# Patient Record
Sex: Male | Born: 1955
Health system: Southern US, Community
[De-identification: ages and names within clinical notes are randomized; demographics above are authoritative.]

## PROBLEM LIST (undated history)

## (undated) DIAGNOSIS — K219 Gastro-esophageal reflux disease without esophagitis: Secondary | ICD-10-CM

## (undated) DIAGNOSIS — B159 Hepatitis A without hepatic coma: Secondary | ICD-10-CM

## (undated) DIAGNOSIS — B589 Toxoplasmosis, unspecified: Secondary | ICD-10-CM

## (undated) DIAGNOSIS — F419 Anxiety disorder, unspecified: Secondary | ICD-10-CM

## (undated) DIAGNOSIS — I10 Essential (primary) hypertension: Secondary | ICD-10-CM

## (undated) HISTORY — PX: ROTATOR CUFF REPAIR: SHX139

## (undated) HISTORY — PX: VASECTOMY: SHX75

## (undated) HISTORY — PX: OTHER SURGICAL HISTORY: SHX169

## (undated) HISTORY — PX: INGUINAL HERNIA REPAIR: SUR1180

## (undated) HISTORY — PX: TOTAL KNEE ARTHROPLASTY: SHX125

## (undated) HISTORY — PX: BACK SURGERY: SHX140

## (undated) HISTORY — PX: COLONOSCOPY: SHX174

## (undated) HISTORY — PX: ELBOW SURGERY: SHX618

---

## 1999-05-03 ENCOUNTER — Ambulatory Visit (HOSPITAL_COMMUNITY): Admission: RE | Admit: 1999-05-03 | Discharge: 1999-05-03 | Payer: Self-pay | Admitting: Orthopedic Surgery

## 1999-05-03 ENCOUNTER — Encounter: Payer: Self-pay | Admitting: Orthopedic Surgery

## 1999-05-23 ENCOUNTER — Encounter: Payer: Self-pay | Admitting: Neurosurgery

## 1999-05-23 ENCOUNTER — Encounter: Admission: RE | Admit: 1999-05-23 | Discharge: 1999-05-23 | Payer: Self-pay | Admitting: Neurosurgery

## 2000-10-16 ENCOUNTER — Ambulatory Visit (HOSPITAL_COMMUNITY): Admission: RE | Admit: 2000-10-16 | Discharge: 2000-10-16 | Payer: Self-pay | Admitting: Neurosurgery

## 2000-10-16 ENCOUNTER — Encounter: Payer: Self-pay | Admitting: Neurosurgery

## 2002-07-02 ENCOUNTER — Encounter: Admission: RE | Admit: 2002-07-02 | Discharge: 2002-09-30 | Payer: Self-pay | Admitting: Family Medicine

## 2004-02-23 ENCOUNTER — Encounter: Admission: RE | Admit: 2004-02-23 | Discharge: 2004-02-23 | Payer: Self-pay | Admitting: Family Medicine

## 2004-03-18 ENCOUNTER — Encounter: Admission: RE | Admit: 2004-03-18 | Discharge: 2004-03-18 | Payer: Self-pay | Admitting: Neurological Surgery

## 2004-05-17 ENCOUNTER — Ambulatory Visit (HOSPITAL_COMMUNITY): Admission: RE | Admit: 2004-05-17 | Discharge: 2004-05-17 | Payer: Self-pay | Admitting: Gastroenterology

## 2004-05-17 ENCOUNTER — Encounter (INDEPENDENT_AMBULATORY_CARE_PROVIDER_SITE_OTHER): Payer: Self-pay | Admitting: *Deleted

## 2005-03-07 ENCOUNTER — Encounter: Admission: RE | Admit: 2005-03-07 | Discharge: 2005-03-07 | Payer: Self-pay | Admitting: Orthopedic Surgery

## 2005-03-24 ENCOUNTER — Ambulatory Visit (HOSPITAL_BASED_OUTPATIENT_CLINIC_OR_DEPARTMENT_OTHER): Admission: RE | Admit: 2005-03-24 | Discharge: 2005-03-24 | Payer: Self-pay | Admitting: Orthopedic Surgery

## 2005-06-13 ENCOUNTER — Encounter: Admission: RE | Admit: 2005-06-13 | Discharge: 2005-06-13 | Payer: Self-pay | Admitting: Anesthesiology

## 2005-06-14 ENCOUNTER — Ambulatory Visit (HOSPITAL_COMMUNITY): Admission: RE | Admit: 2005-06-14 | Discharge: 2005-06-14 | Payer: Self-pay | Admitting: Neurological Surgery

## 2005-11-17 ENCOUNTER — Encounter: Admission: RE | Admit: 2005-11-17 | Discharge: 2005-11-17 | Payer: Self-pay | Admitting: Neurological Surgery

## 2005-12-11 ENCOUNTER — Encounter: Admission: RE | Admit: 2005-12-11 | Discharge: 2005-12-11 | Payer: Self-pay | Admitting: Unknown Physician Specialty

## 2006-03-08 ENCOUNTER — Ambulatory Visit (HOSPITAL_COMMUNITY): Admission: RE | Admit: 2006-03-08 | Discharge: 2006-03-09 | Payer: Self-pay | Admitting: Neurological Surgery

## 2006-04-03 ENCOUNTER — Encounter: Admission: RE | Admit: 2006-04-03 | Discharge: 2006-04-03 | Payer: Self-pay | Admitting: Neurological Surgery

## 2006-06-04 ENCOUNTER — Encounter: Admission: RE | Admit: 2006-06-04 | Discharge: 2006-06-04 | Payer: Self-pay | Admitting: Neurological Surgery

## 2006-09-17 ENCOUNTER — Encounter: Admission: RE | Admit: 2006-09-17 | Discharge: 2006-09-17 | Payer: Self-pay | Admitting: Neurological Surgery

## 2006-12-13 ENCOUNTER — Ambulatory Visit (HOSPITAL_COMMUNITY): Admission: RE | Admit: 2006-12-13 | Discharge: 2006-12-13 | Payer: Self-pay | Admitting: Neurological Surgery

## 2006-12-13 ENCOUNTER — Encounter (INDEPENDENT_AMBULATORY_CARE_PROVIDER_SITE_OTHER): Payer: Self-pay | Admitting: Neurological Surgery

## 2008-02-07 IMAGING — CR DG CERVICAL SPINE 2 OR 3 VIEWS
1 series · 1 of 1 positions shown · non-contrast
Comparison: [DATE].

CLINICAL DATA: T1-2 microdiscectomy.
 PORTABLE CERVICAL SPINE ? 2 VIEW:

[view not recorded]
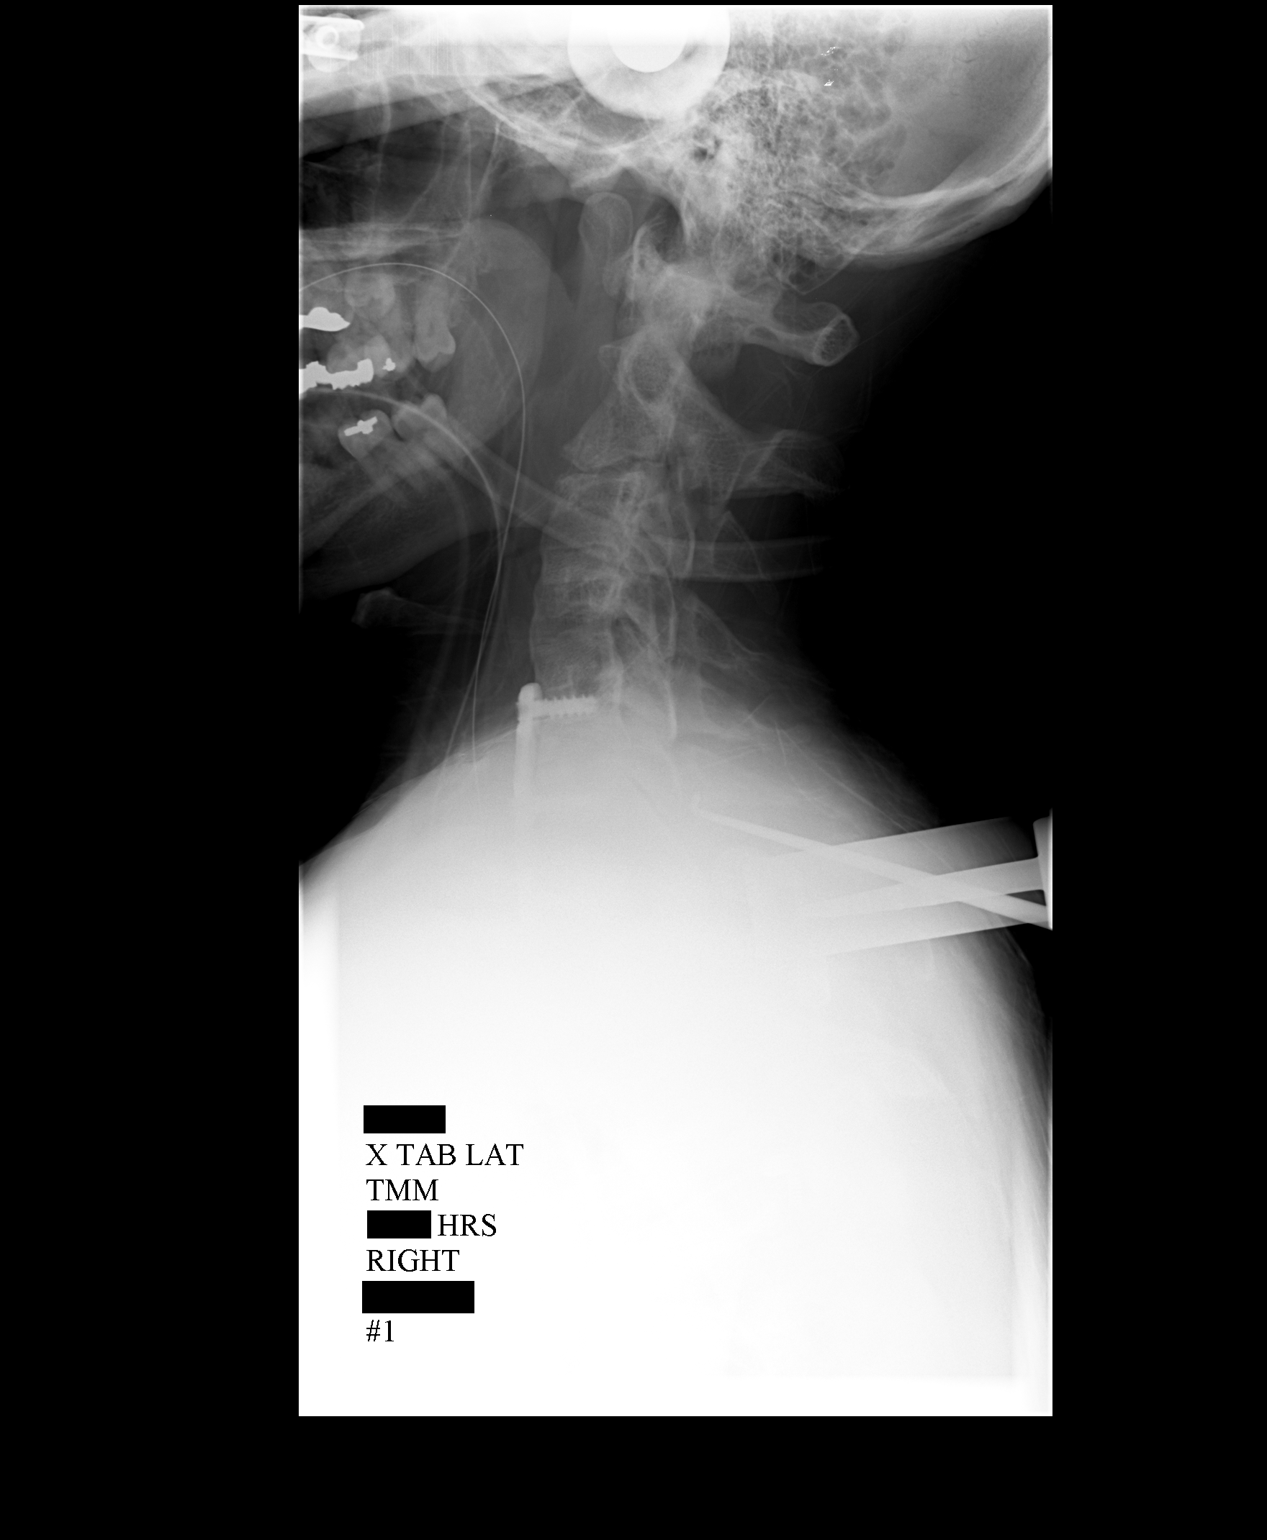

[1 of 1 positions shown; findings below may reference images not displayed]

FINDINGS: Initial film shows tissue spreaders in place posteriorly at T7 level with a probe between the spinous processes of T6 and T7.  A 2nd film shows a clamp on the spinous process of C7.
IMPRESSION: As discussed above.

## 2008-04-02 ENCOUNTER — Encounter: Admission: RE | Admit: 2008-04-02 | Discharge: 2008-04-02 | Payer: Self-pay | Admitting: Neurological Surgery

## 2009-02-26 ENCOUNTER — Encounter: Admission: RE | Admit: 2009-02-26 | Discharge: 2009-02-26 | Payer: Self-pay | Admitting: Orthopedic Surgery

## 2009-04-23 ENCOUNTER — Inpatient Hospital Stay (HOSPITAL_COMMUNITY): Admission: RE | Admit: 2009-04-23 | Discharge: 2009-04-25 | Payer: Self-pay | Admitting: Orthopedic Surgery

## 2010-03-13 ENCOUNTER — Encounter: Payer: Self-pay | Admitting: Orthopedic Surgery

## 2010-05-11 LAB — COMPREHENSIVE METABOLIC PANEL
ALT: 26 U/L (ref 0–53)
AST: 31 U/L (ref 0–37)
Albumin: 4.2 g/dL (ref 3.5–5.2)
Calcium: 9.8 mg/dL (ref 8.4–10.5)
Creatinine, Ser: 1.12 mg/dL (ref 0.4–1.5)
GFR calc Af Amer: >60 mL/min (ref >60)
GFR calc non Af Amer: >60 mL/min (ref >60)
Sodium: 131 mEq/L — ABNORMAL LOW (ref 135–145)
Total Protein: 6.9 g/dL (ref 6.0–8.3)

## 2010-05-11 LAB — CBC
MCHC: 35.7 g/dL (ref 30.0–36.0)
MCV: 100.7 fL — ABNORMAL HIGH (ref 78.0–100.0)
Platelets: 218 10*3/uL (ref 150–400)
RDW: 12.8 % (ref 11.5–15.5)

## 2010-05-11 LAB — PROTIME-INR: Prothrombin Time: 12.7 seconds (ref 11.6–15.2)

## 2010-05-11 LAB — URINALYSIS, ROUTINE W REFLEX MICROSCOPIC
Glucose, UA: NEGATIVE mg/dL
Protein, ur: NEGATIVE mg/dL
Specific Gravity, Urine: 1.025 (ref 1.005–1.030)

## 2010-05-11 LAB — URINE CULTURE: Colony Count: NO GROWTH

## 2010-05-11 LAB — DIFFERENTIAL
Eosinophils Relative: 0 % (ref 0–5)
Lymphocytes Relative: 29 % (ref 12–46)
Lymphs Abs: 1.9 10*3/uL (ref 0.7–4.0)
Monocytes Absolute: 0.5 10*3/uL (ref 0.1–1.0)
Monocytes Relative: 7 % (ref 3–12)

## 2010-05-11 LAB — TYPE AND SCREEN: ABO/RH(D): A POS

## 2010-05-15 LAB — BASIC METABOLIC PANEL
BUN: 5 mg/dL — ABNORMAL LOW (ref 6–23)
BUN: 8 mg/dL (ref 6–23)
CO2: 30 mEq/L (ref 19–32)
CO2: 32 mEq/L (ref 19–32)
Calcium: 8.3 mg/dL — ABNORMAL LOW (ref 8.4–10.5)
Chloride: 94 mEq/L — ABNORMAL LOW (ref 96–112)
Chloride: 96 mEq/L (ref 96–112)
Creatinine, Ser: 1.04 mg/dL (ref 0.4–1.5)
Creatinine, Ser: 1.14 mg/dL (ref 0.4–1.5)
GFR calc Af Amer: >60 mL/min (ref >60)
Potassium: 3 mEq/L — ABNORMAL LOW (ref 3.5–5.1)

## 2010-05-15 LAB — CBC
HCT: 30 % — ABNORMAL LOW (ref 39.0–52.0)
MCHC: 34.8 g/dL (ref 30.0–36.0)
MCHC: 35.7 g/dL (ref 30.0–36.0)
MCV: 101.4 fL — ABNORMAL HIGH (ref 78.0–100.0)
MCV: 102.1 fL — ABNORMAL HIGH (ref 78.0–100.0)
Platelets: 133 10*3/uL — ABNORMAL LOW (ref 150–400)
Platelets: 142 10*3/uL — ABNORMAL LOW (ref 150–400)
RBC: 2.85 MIL/uL — ABNORMAL LOW (ref 4.22–5.81)
RBC: 2.96 MIL/uL — ABNORMAL LOW (ref 4.22–5.81)
RDW: 12.5 % (ref 11.5–15.5)

## 2010-06-28 ENCOUNTER — Other Ambulatory Visit: Payer: Self-pay | Admitting: Neurological Surgery

## 2010-06-28 DIAGNOSIS — M549 Dorsalgia, unspecified: Secondary | ICD-10-CM

## 2010-07-04 ENCOUNTER — Ambulatory Visit
Admission: RE | Admit: 2010-07-04 | Discharge: 2010-07-04 | Disposition: A | Discharge status: Home / Self Care | Payer: BC Managed Care – PPO | Source: Ambulatory Visit | Attending: Neurological Surgery | Admitting: Neurological Surgery

## 2010-07-04 DIAGNOSIS — M549 Dorsalgia, unspecified: Secondary | ICD-10-CM

## 2010-07-04 MED ORDER — GADOBENATE DIMEGLUMINE 529 MG/ML IV SOLN
16.0000 mL | Freq: Once | INTRAVENOUS | Status: AC | PRN
  Administered 2010-07-04: 16 mL via INTRAVENOUS

## 2010-07-05 NOTE — Op Note (Signed)
Andrew Suarez, Andrew Suarez              ACCOUNT NO.:  [id number]   MEDICAL RECORD NO.:  [id number]          PATIENT TYPE:  OIB   LOCATION:  3172                         FACILITY:  [facility]   PHYSICIAN:  Tia Alert, MD     DATE OF BIRTH:  Jul 31, 1955   DATE OF PROCEDURE:  12/13/2006  DATE OF DISCHARGE:                               OPERATIVE REPORT   The skin distance of the second surgery detailed report on Andrew Suarez had  PTT a Y pain with [id number]   PREOPERATIVE DIAGNOSIS:  Left T1-2 foraminal stenosis with synovial cyst  with left T1 radiculopathy.   POSTOPERATIVE DIAGNOSIS:  Left T1-2 foraminal stenosis with synovial  cyst with left T1 radiculopathy.   OPERATION/PROCEDURE:  Left T1-2 foraminotomy and resection of synovial  cyst, utilizing microscopic dissection.   SURGEON:  Tia Alert, M.D.   ASSISTANT:  Reinaldo Meeker, M.D.   ANESTHESIA:  General endotracheal.   COMPLICATIONS:  None apparent.   INDICATIONS FOR PROCEDURE:  Andrew Suarez is a 55 year old gentleman who  is an old patient of mine who came in complaining of left-sided arm pain  and interscapular pain that seemed to follow T1 distribution.  He had  numbness in the fourth and fifth digits of the left hand.  He had an MRI  which showed significant foraminal stenosis at T1-2 on the left with a  significant synovial cyst.  I recommended a foraminotomy T1-2 on the  left.  He understood the risks, benefits, and expected outcome and  wished to proceed.   DESCRIPTION OF PROCEDURE:  The patient was taken to operating room and  after induction of adequate generalized endotracheal anesthesia, he was  rolled into the prone position on chest rolls and his head was affixed  in three-point May[place] head rest in a slightly flexed position.  His  upper thoracic region was shaved and prepped with DuraPrep and then  draped in the usual sterile fashion.  Local anesthesia, 5 mL,  was  injected and a small dorsal midline incision  was made and carried down  to the cervical fascia.  It was opened and the paraspinous musculature  was taken down in subperiosteal fashion to expose C7-T1 on the left.  Intraoperative x-ray confirmed my level at C6-7.  I moved down two  levels and there used combination of high-speed drill and the 1- and 2-  mm Kerrison punches to perform a keyhole foraminotomy at T1-2.  The  underlying yellow ligament was removed as a guide out laterally, and  over the T1 nerve root I noticed a small cystic structure which was then  removed with the Kerrison punch and sent for permanent pathology.  I  continued to march along the nerve root, marching along the superior  part of the pedicle, widening the foraminotomy as I went.  I then got  distal to the pedicle and then palpated along the nerve root.  I felt  like I had a good decompression of the nerve root itself. I saw no more  of the synovial cyst.  It was gone.  I then irrigated  with saline  solution containing bacitracin, dried the surgical bed with Surgifoam,  irrigated this away.  I got one more x-ray with a clamp on the C7  spinous process to assure that I was at the right level.  Again, this  confirmed that I was at the correct level at the C7 spinous process with  my clamp.  I then irrigated once again, lined the dura with Gelfoam,  inspected for any bleeding points, and then closed the fascia with 0  Vicryl, closing subcutaneous and subcuticular tissues with 2-0 and 3-0  Vicryl, closed the skin with Benzoin Steri-Strips.  The  drapes were removed.  A sterile dressing was applied.  The patient was  awakened from general anesthesia and transferred to the recovery room in  stable condition.  At the end of the procedure all sponge, needle and  instrument counts were correct.      Tia Alert, MD  Electronically Signed     DSJ/MEDQ  D:  12/13/2006  T:  12/14/2006  Job:  [id number]

## 2010-07-08 NOTE — Op Note (Signed)
NAMENICCOLAS, LOEPER              ACCOUNT NO.:  [id number]   MEDICAL RECORD NO.:  [id number]          PATIENT TYPE:  AMB   LOCATION:  DSC                          FACILITY:  [facility]   PHYSICIAN:  Dyke Brackett, M.D.    DATE OF BIRTH:  1955-07-26   DATE OF PROCEDURE:  04/17/2005  DATE OF DISCHARGE:                                 OPERATIVE REPORT   NOTE:  This was dictated from memory.   PREOPERATIVE DIAGNOSIS:  Torn medial and lateral menisci.   POSTOPERATIVE DIAGNOSIS:  Torn medial and lateral menisci, with grade 4  chondral lesion of lateral femur and trochlear groove.   OPERATIONS:  1.  Left knee arthroscopy.  2.  Partial medial and lateral meniscectomies.  3.  Debridement and chondroplasty.  4.  Microfracture of trochlear groove and lateral femoral condyle.   SURGEON:  Dyke Brackett, M.D.   ANESTHESIA:  MAC.   DESCRIPTION OF PROCEDURE:  The patient had an arthroscopic evaluation  through an inferomedial and inferolateral portal. He had meniscal tearing  that was mild. Previous diagnosis of a meniscectomy with surgery elsewhere,  but it was difficult to say how much meniscus had been removed in the past.  There was a small posterior horn tear that was resected. Chondral surfaces  in this area showed some mild wear, which were debrided. ACL was grossly  intact. Lateral compartment showed a small fraying type tear with a little  less degenerative change. I would classify the degenerative change in the  tib-fib articulation as not being severe.   On the trochlear groove side, which was not weightbearing, but would be in  contact with the patella at 28 to 45 degrees of flexion, there was extension  grade 4 lesion of the lateral compartment. The patella itself was relatively  spared, but these were down to eburnated bone, with no articular cartilage  whatsoever. This lesion geographically measured at least 2 x 3 cm. The  patellar side, though, did look reasonably good. For this  reason, a  microfracture was accomplished with multiple punctate holes being placed  into the bone until bleeding subchondral bone could be exposed. This was  done over the whole area. The wound was closed with nylon and was  infiltrated with Marcaine and morphine. He was taken to the recovery room in  stable condition.      Dyke Brackett, M.D.  Electronically Signed    WDC/MEDQ  D:  04/26/2005  T:  04/27/2005  Job:  [id number]

## 2010-07-08 NOTE — Op Note (Signed)
NAMELANTZ, HERMANN              ACCOUNT NO.:  [id number]   MEDICAL RECORD NO.:  [id number]          PATIENT TYPE:  AMB   LOCATION:  SDS                          FACILITY:  [facility]   PHYSICIAN:  Tia Alert, MD     DATE OF BIRTH:  February 15, 1956   DATE OF PROCEDURE:  03/08/2006  DATE OF DISCHARGE:                               OPERATIVE REPORT   PREOPERATIVE DIAGNOSES:  1. Pseudoarthrosis, C5-6, status post anterior cervical diskectomy and      fusion with plating at C3-4, C4-5, C5-6, with a CSLP anterior      cervical plate.  2. Degenerative disk disease with spondylosis, C6-7.  3. Neck pain.   POSTOPERATIVE DIAGNOSES:  1. Pseudoarthrosis, C5-6, status post anterior cervical diskectomy and      fusion with plating at C3-4, C4-5, C5-6, with a CSLP anterior      cervical plate.  2. Degenerative disk disease with spondylosis, C6-7.  3. Neck pain.   PROCEDURES:  1. Cervical re-exploration with removal of the anterior cervical      hardware, C3-C6 inclusive.  2. Exploration of fusion, C3-4, C4-5 and C5-6, with a pseudoarthrosis      identified at C5-6.  3. Anterior cervical diskectomy C5-6, C6-7.  4. Anterior cervical arthrodesis C5-6, C6-7, utilizing a 6-mm MTF      corticocancellous allograft at C5-6 and an 8-mm MTF      corticocancellous allograft at C6-7.  5. Anterior cervical plating, C5-C7 inclusive, utilizing a 47.5-mm      Venture plate.   SURGEON:  Tia Alert, MD   ASSISTANT:  Reinaldo Meeker, MD   ANESTHESIA:  General endotracheal.   COMPLICATIONS:  None apparent.   INDICATIONS FOR PROCEDURE:  Mr. Andrew Suarez is a 55 year old male who had  undergone a previous three-level ACDF with plating at C3-4, C4-5, and C5-  6 by a Careers adviser in [place], [place].  He presented with severe  neck pain with some radiation into his shoulders.  He had an, MRI plain  films and a CT scan, which showed a pseudoarthrosis at the C5-6 level  with solid fusion at C3-4 and C4-5  with a breakdown of the C6-7 disk  space with degenerative disease and spondylosis.  He had tried medical  management for quite some time without significant relief.  I  recommended a left cervical re-exploration with removal of the anterior  hardware, a repeat diskectomy and fusion at C5-6, and a diskectomy and  fusion at C6-7.  He understood the risks, benefits and expected outcome  and wished to proceed.   DESCRIPTION OF PROCEDURE:  The patient was taken to the operating room  after induction of adequate generalized endotracheal anesthesia, he was  placed in the supine position on the operating room table and his  anterior cervical region was prepped with DuraPrep and then draped in  usual sterile fashion.  The patient had an old incision on the left  side.  Therefore, we chose to go on the left side once again.  We made  an incision about an inch below the old incision and  carried down to the  platysma, which was elevated, opened and undermined with Metzenbaum  scissors.  I then dissected in a plane medial to the sternocleidomastoid  muscle and internal carotid artery and lateral to the trachea and  esophagus to expose C5-6 and C6-7.  We found the anterior plate.  We  dissected through the scar tissue, continued to dissect superiorly until  the top of the plate was identified.  We took the scar tissue away from  all of the screws in the anterior cervical plate.  We used the  screwdrivers to remove the screws from the plate and then remove the  plate from the anterior cervical spine at C3 to C6.  We explored the  fusions at C3-4 and C4-5 and found those to be solid, but C5-6 had an  obvious pseudoarthrosis with movement between C5 and C6.  We took down  the longus colli muscles and placed our Shadow Line retractors under  these to expose C5-6 and C6-7.  Intraoperative fluoroscopy confirmed my  level though it was obvious based on the previous surgery.  We incised  each disk space.   The disk space at C5-6 obviously very collapsed.  We  used the high-speed drill to drill through the pseudoarthrosis to a  height of 6 mm.  We drilled this in a rectangular fashion.  We did not  plan to take this all the way down to the dura and identify the nerve  roots.  We felt this would be risky and the patient had minimal symptoms  in radicular fashion and we felt like he was symptomatic from the  pseudoarthrosis and only needed a repeat fusion.  We drilled until we  had bleeding edges and drilled to a depth of about 12-13 mm.  We then  did the same thing at C6-7.  We drilled the disk space to a height of 8  mm and drilled down to the level of the posterior longitudinal ligament,  which was opened with a nerve hook and then removed in a circumferential  fashion while undercutting the bodies of C5-C6 with a 1 and 2 mm  Kerrison punch.  The dura was identified and the cord was decompressed.  This was done under microscopic dissection.  We then irrigated with  saline solution and measured our interspace to be 8 mm and used an 8 mm  MTF corticocancellous allograft and tapped this into position at C6-7.  A 6 mm graft was used at C5-6.  We then used a 47.5-mm Venture plate.  The screw holes lined up at C5 and C6.  We used 13 mm rescue screws at  C5 and C6 and used 13 mm variable-angled screws and C7, and these locked  in the plate by the locking mechanism.  We then spent considerable time  drying all bleeding points with bipolar cautery.  We irrigated with  saline solution containing bacitracin, inspected our construct once  again, got our final fluoroscopy shots, and then closed the platysma  with 3-0 Vicryl, closed the subcuticular tissue with 3-0 Vicryl, and  closed the skin with Benzoin and Steri-Strips.  The drapes were removed  in a sterile dressing was applied.  The patient was awakened from  general anesthesia and transported to the recovery room in stable condition.  At the end of  the procedure all sponge, needle and  instrument counts were correct.      Tia Alert, MD  Electronically Signed     DSJ/MEDQ  D:  03/08/2006  T:  03/08/2006  Job:  [id number]

## 2010-07-08 NOTE — Op Note (Signed)
NAMEKABE, MCKOY              ACCOUNT NO.:  [id number]   MEDICAL RECORD NO.:  [id number]          PATIENT TYPE:  AMB   LOCATION:  DSC                          FACILITY:  [facility]   PHYSICIAN:  Dyke Brackett, M.D.    DATE OF BIRTH:  1956-01-08   DATE OF PROCEDURE:  03/24/2005  DATE OF DISCHARGE:                                 OPERATIVE REPORT   INDICATIONS FOR PROCEDURE:  Age 55 with severe knee pain.  An MRI showed  possible anterior and medial  lateral meniscus with some degenerative change  noted medically.  His pain was very severe and not responding to  conservative treatment, thought to be amenable to outpatient surgery.   PREOPERATIVE DIAGNOSIS:  Osteoarthritis of the knee with lateral meniscus  tear.   POSTOPERATIVE DIAGNOSES:  1.  Severe grade 4 chondral lesion anterior lateral femur 3.5 cm x 2.0 cm      lesion.  2.  Medial and lateral meniscus tears.  3.  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   OPERATION:  1.  Partial medial and lateral meniscectomy.  2.  Debridement chondroplasty of the patellofemoral joint, medial      compartment.  3.  Micro-fracture of lateral femoral condyle.   SURGEON:  Dyke Brackett, M.D.   ANESTHESIA:  MAC.   DESCRIPTION OF PROCEDURE:  An arthroscopic evaluation showed a lot of  debridement of the knee relative to the articular cartilage pieces.  Initially it was not clear where these were coming from.  There was grade 3  chondromalacia of the patella on the medial facet of the patella.  There was  additionally medial compartmental changes that were grade 2 and early grade  3, rather generalized.  A small tear of the posterior horn of the medial  meniscus debrided.  A small anterior lateral horn tear of the meniscus as  well.  The area in question, the severe area, was about a 3.5 cm x 2.0 cm  area of the femoral condyle which was really down to raw bone.  It was  amenable to multiple micro-fractures over  the whole area of the lesion,  which went well.  It was an extensive lesion.  It should be noted in the  record that the knee when fully extended did not contact this area,  indicating that it was technically non-weightbearing relative to femoral-  fibular articulation.  The micro-fracture was accomplished without  difficulty, in addition to the medial lateral meniscectomy and debridement.  A knee drained free of fluids.  The knee was injected with Marcaine and  __________ and 40 mg of Depo-Medrol.      Dyke Brackett, M.D.  Electronically Signed    WDC/MEDQ  D:  03/24/2005  T:  03/24/2005  Job:  [id number]

## 2010-07-08 NOTE — Op Note (Signed)
NAMEBRADIE, LACOCK              ACCOUNT NO.:  [id number]   MEDICAL RECORD NO.:  [id number]          PATIENT TYPE:  AMB   LOCATION:  ENDO                         FACILITY:  [facility]   PHYSICIAN:  Graylin Shiver, M.D.   DATE OF BIRTH:  22-May-1955   DATE OF PROCEDURE:  05/17/2004  DATE OF DISCHARGE:                                 OPERATIVE REPORT   PROCEDURE:  Colonoscopy with biopsy.   INDICATIONS:  Screening.   Informed consent was obtained after explanation of the risks of bleeding,  infection, and perforation.   PREMEDICATIONS:  Fentanyl 70 mcg IV, Versed 7 mg IV.   PROCEDURE:  With the patient in the left lateral decubitus position, a  rectal exam was performed.  No masses were felt.  The Olympus colonoscope  was inserted into the rectum and advanced around the colon to the cecum.  Cecal landmarks were identified.  The cecum and ascending colon were normal.  The transverse colon normal.  In the proximal descending colon, there was a  3 mm polyp biopsied off with cold forceps.  The sigmoid, and rectum were  normal.  He tolerated the procedure well without complications.   IMPRESSION:  Small descending colon polyp.   PLAN:  The pathology will be checked.      SFG/MEDQ  D:  05/17/2004  T:  05/17/2004  Job:  [id number]   cc:   Otilio Connors. Gerri Spore, M.D.  [address]  [place]  [place] [address]  Fax: [phone]

## 2010-07-08 NOTE — Op Note (Signed)
Andrew Suarez, Andrew Suarez              ACCOUNT NO.:  [id number]   MEDICAL RECORD NO.:  [id number]          PATIENT TYPE:  OIB   LOCATION:  3028                         FACILITY:  [facility]   PHYSICIAN:  Tia Alert, MD     DATE OF BIRTH:  May 23, 1955   DATE OF PROCEDURE:  06/14/2005  DATE OF DISCHARGE:  06/14/2005                                 OPERATIVE REPORT   PREOPERATIVE DIAGNOSIS:  Far lateral disk herniation, L4-5on the right with  right L4 radiculopathy.   POSTOPERATIVE DIAGNOSIS:  Far lateral disk herniation, L4-5on the right with  right L4 radiculopathy.   OPERATION PERFORMED:  Extraforaminal microdiskectomy, L4-5 on the right  utilizing microscopic dissection.   SURGEON:  Tia Alert, MD   ASSISTANT:  Donalee Citrin, M.D.   ANESTHESIA:  General endotracheal.   COMPLICATIONS:  None apparent.   INDICATIONS FOR PROCEDURE:  Andrew Suarez is a 55 year old white male who was  referred with right leg pain that seemed to fall in the L4 distribution.  He  had an MRI which showed a far lateral disk herniation at L4-5 on the right  side.  I first saw him in January of 2006.  He underwent nerve root blocks  which seemed to help for quite some time.  He returned more recently with  increasing pain in the same distribution.  MRI showed residual disk  herniation at L4-5 on the right side in the extraforaminal space with  compression of the right L4 nerve root.  He had tried medical management for  quite some time without significant relief.  He felt he was in severe pain.  He wanted to proceed with more aggressive therapy in the form of surgical  decompression and diskectomy.  He understood the risks and benefits and the  expected outcome of the procedure and wished to proceed.   DESCRIPTION OF PROCEDURE:  The patient was taken to the operating room and  after induction of adequate general endotracheal anesthesia, he was rolled  into the prone position over the Wilson frame and all  pressure points were  padded.  The lumbar region was prepped with DuraPrep and then draped in the  usual sterile fashion.  5 mL of local anesthesia was injected and a dorsal  midline incision was made and carried down to the lumbosacral fascia.  The  fascia was opened on the patient's right side and the paraspinous  musculature was taken down in a subperiosteal fashion to expose L4-5.  We  carried the dissection out over the facets to expose the transverse process  of L4.  Intraoperative x-ray confirmed our level here.  I then was able to  drill the superior part of the facet and lateral pars.  I brought in the  operating microscope, identified the yellow ligament just inferior to the  transverse process and pedicle.  I was able to open this, identify the  epidural fat and then identify the nerve root just under this.  I then  continued to remove yellow ligament and decompress the nerve root.  I under  cut the facet superiorly and  the pars more medially to decompress the nerve  root.  I was then able to retract the nerve root cephalad and identify a  subannular disk herniation.  I incised the disk space and performed a  thorough intradiskal diskectomy reaching both medially and laterally.  I was  able to push down on a large fragment at the superior edge of the disk space  which was compressing the L4 nerve root and removed this with a pituitary  rongeur.  I believe this was the symptomatic fragment.  I then completed the  diskectomy, palpated with the nerve hook and then with a coronary dilator  both medially and laterally along the nerve root to assure adequate  decompression.  I palpated along the superior part of the nerve root as well  as inferior to the nerve root and the underside of the nerve root.  Felt  like I had a good decompression of the nerve root.  It looked free.  I then  irrigated with copious amounts of bacitracin-containing saline solution,  lined the nerve root with  Gelfoam and then closed the fascia with  interrupted #1 Vicryl, closed the subcutaneous and subcuticular tissue with  2-0 and 3-0 Vicryl and closed the skin with Dermabond.  The drapes were  removed.  The patient was awakened from general anesthesia and transported  to the recovery room in stable condition.  At the end of the procedure, all  sponge, needle and instrument counts were correct.      Tia Alert, MD  Electronically Signed     DSJ/MEDQ  D:  06/14/2005  T:  06/15/2005  Job:  [id number]

## 2010-11-30 LAB — COMPREHENSIVE METABOLIC PANEL
ALT: 45
AST: 37
Albumin: 3.8
Alkaline Phosphatase: 38 — ABNORMAL LOW
CO2: 27
Chloride: 100
Creatinine, Ser: 1.24
GFR calc Af Amer: >60
GFR calc non Af Amer: >60
Potassium: 4
Sodium: 132 — ABNORMAL LOW
Total Bilirubin: 0.8

## 2010-11-30 LAB — CBC
MCV: 98.1
Platelets: 211
RBC: 4 — ABNORMAL LOW
WBC: 6.8

## 2010-11-30 LAB — DIFFERENTIAL
Basophils Absolute: 0
Eosinophils Absolute: 0
Eosinophils Relative: 0
Lymphocytes Relative: 28
Monocytes Absolute: 0.5

## 2010-11-30 LAB — PROTIME-INR: Prothrombin Time: 13

## 2011-10-17 ENCOUNTER — Other Ambulatory Visit: Payer: Self-pay | Admitting: Anesthesiology

## 2011-10-17 DIAGNOSIS — M549 Dorsalgia, unspecified: Secondary | ICD-10-CM

## 2011-10-30 ENCOUNTER — Ambulatory Visit
Admission: RE | Admit: 2011-10-30 | Discharge: 2011-10-30 | Disposition: A | Discharge status: Home / Self Care | Payer: BC Managed Care – PPO | Source: Ambulatory Visit | Attending: Anesthesiology | Admitting: Anesthesiology

## 2011-10-30 DIAGNOSIS — M549 Dorsalgia, unspecified: Secondary | ICD-10-CM

## 2011-12-19 ENCOUNTER — Other Ambulatory Visit: Payer: Self-pay | Admitting: Neurological Surgery

## 2012-01-29 ENCOUNTER — Encounter (HOSPITAL_COMMUNITY): Payer: Self-pay | Admitting: Pharmacy Technician

## 2012-02-01 ENCOUNTER — Encounter (HOSPITAL_COMMUNITY)
Admission: RE | Admit: 2012-02-01 | Discharge: 2012-02-01 | Disposition: A | Discharge status: Home / Self Care | Payer: BC Managed Care – PPO | Source: Ambulatory Visit | Attending: Neurological Surgery | Admitting: Neurological Surgery

## 2012-02-01 ENCOUNTER — Encounter (HOSPITAL_COMMUNITY): Payer: Self-pay

## 2012-02-01 HISTORY — DX: Essential (primary) hypertension: I10

## 2012-02-01 HISTORY — DX: Toxoplasmosis, unspecified: B58.9

## 2012-02-01 HISTORY — DX: Hepatitis a without hepatic coma: B15.9

## 2012-02-01 HISTORY — DX: Gastro-esophageal reflux disease without esophagitis: K21.9

## 2012-02-01 LAB — COMPREHENSIVE METABOLIC PANEL
ALT: 21 U/L (ref 0–53)
AST: 24 U/L (ref 0–37)
Albumin: 3.8 g/dL (ref 3.5–5.2)
CO2: 26 mEq/L (ref 19–32)
Chloride: 97 mEq/L (ref 96–112)
Creatinine, Ser: 1.17 mg/dL (ref 0.50–1.35)
GFR calc non Af Amer: 67 mL/min — ABNORMAL LOW (ref >90)
Sodium: 134 mEq/L — ABNORMAL LOW (ref 135–145)
Total Bilirubin: 0.6 mg/dL (ref 0.3–1.2)

## 2012-02-01 LAB — CBC WITH DIFFERENTIAL/PLATELET
Basophils Relative: 1 % (ref 0–1)
Eosinophils Absolute: 0.1 10*3/uL (ref 0.0–0.7)
Eosinophils Relative: 1 % (ref 0–5)
Hemoglobin: 13.4 g/dL (ref 13.0–17.0)
Lymphs Abs: 2 10*3/uL (ref 0.7–4.0)
MCH: 33.3 pg (ref 26.0–34.0)
MCHC: 35.1 g/dL (ref 30.0–36.0)
MCV: 95 fL (ref 78.0–100.0)
Monocytes Relative: 8 % (ref 3–12)
Platelets: 214 10*3/uL (ref 150–400)
RBC: 4.02 MIL/uL — ABNORMAL LOW (ref 4.22–5.81)

## 2012-02-01 LAB — SURGICAL PCR SCREEN: MRSA, PCR: NEGATIVE

## 2012-02-01 LAB — TYPE AND SCREEN: Antibody Screen: NEGATIVE

## 2012-02-01 NOTE — Progress Notes (Signed)
Verified with Erie Noe abbreviation MAS to mean Maximum access surgery.

## 2012-02-01 NOTE — Pre-Procedure Instructions (Signed)
20 Andrew Suarez  02/01/2012   Your procedure is scheduled on:  December 20  Report to [facility] at 05:30 AM.  Call this number if you have problems the morning of surgery: [phone]   Remember:   Do not eat or drink:After Midnight.  Take these medicines the morning of surgery with A SIP OF WATER: Amlodipine, Atenolol, Hydrocodone (if needed), Omeprazole     STOP Ibuprofen and Multiple Vitamins after tomorrow December 13  Do not wear jewelry, make-up or nail polish.  Do not wear lotions, powders, or perfumes. You may wear deodorant.  Do not shave 48 hours prior to surgery. Men may shave face and neck.  Do not bring valuables to the hospital.  Contacts, dentures or bridgework may not be worn into surgery.  Leave suitcase in the car. After surgery it may be brought to your room.  For patients admitted to the hospital, checkout time is 11:00 AM the day of discharge.   Special Instructions: Shower using CHG 2 nights before surgery and the night before surgery.  If you shower the day of surgery use CHG.  Use special wash - you have one bottle of CHG for all showers.  You should use approximately 1/3 of the bottle for each shower.   Please read over the following fact sheets that you were given: Pain Booklet, Coughing and Deep Breathing, Blood Transfusion Information and Surgical Site Infection Prevention

## 2012-02-08 MED ORDER — CEFAZOLIN SODIUM-DEXTROSE 2-3 GM-% IV SOLR
2.0000 g | INTRAVENOUS | Status: AC
  Administered 2012-02-09: 2 g via INTRAVENOUS
  Filled 2012-02-08: qty 50

## 2012-02-09 ENCOUNTER — Encounter (HOSPITAL_COMMUNITY): Payer: Self-pay | Admitting: Anesthesiology

## 2012-02-09 ENCOUNTER — Encounter (HOSPITAL_COMMUNITY): Payer: Self-pay | Admitting: Neurological Surgery

## 2012-02-09 ENCOUNTER — Inpatient Hospital Stay (HOSPITAL_COMMUNITY): Payer: BC Managed Care – PPO | Admitting: Anesthesiology

## 2012-02-09 ENCOUNTER — Inpatient Hospital Stay (HOSPITAL_COMMUNITY)
Admission: RE | Admit: 2012-02-09 | Discharge: 2012-02-10 | DRG: 756 | Disposition: A | Discharge status: Home / Self Care | Payer: BC Managed Care – PPO | Source: Ambulatory Visit | Attending: Neurological Surgery | Admitting: Neurological Surgery

## 2012-02-09 ENCOUNTER — Encounter (HOSPITAL_COMMUNITY): Payer: Self-pay | Admitting: *Deleted

## 2012-02-09 ENCOUNTER — Encounter (HOSPITAL_COMMUNITY)
Admission: RE | Disposition: A | Discharge status: Home / Self Care | Payer: Self-pay | Source: Ambulatory Visit | Attending: Neurological Surgery

## 2012-02-09 ENCOUNTER — Inpatient Hospital Stay (HOSPITAL_COMMUNITY): Payer: BC Managed Care – PPO

## 2012-02-09 DIAGNOSIS — M47817 Spondylosis without myelopathy or radiculopathy, lumbosacral region: Principal | ICD-10-CM | POA: Diagnosis present

## 2012-02-09 DIAGNOSIS — Z79899 Other long term (current) drug therapy: Secondary | ICD-10-CM

## 2012-02-09 DIAGNOSIS — M51379 Other intervertebral disc degeneration, lumbosacral region without mention of lumbar back pain or lower extremity pain: Secondary | ICD-10-CM | POA: Diagnosis present

## 2012-02-09 DIAGNOSIS — Z01812 Encounter for preprocedural laboratory examination: Secondary | ICD-10-CM

## 2012-02-09 DIAGNOSIS — Z96659 Presence of unspecified artificial knee joint: Secondary | ICD-10-CM

## 2012-02-09 DIAGNOSIS — K219 Gastro-esophageal reflux disease without esophagitis: Secondary | ICD-10-CM | POA: Diagnosis present

## 2012-02-09 DIAGNOSIS — I10 Essential (primary) hypertension: Secondary | ICD-10-CM | POA: Diagnosis present

## 2012-02-09 DIAGNOSIS — Z01818 Encounter for other preprocedural examination: Secondary | ICD-10-CM

## 2012-02-09 DIAGNOSIS — M5137 Other intervertebral disc degeneration, lumbosacral region: Secondary | ICD-10-CM | POA: Diagnosis present

## 2012-02-09 DIAGNOSIS — Z01810 Encounter for preprocedural cardiovascular examination: Secondary | ICD-10-CM

## 2012-02-09 DIAGNOSIS — Q762 Congenital spondylolisthesis: Secondary | ICD-10-CM

## 2012-02-09 SURGERY — POSTERIOR LUMBAR FUSION 1 LEVEL
Anesthesia: General | Site: Back | Wound class: Clean

## 2012-02-09 MED ORDER — ONDANSETRON HCL 4 MG/2ML IJ SOLN: 4.0000 mg | INTRAMUSCULAR | Status: DC | PRN

## 2012-02-09 MED ORDER — 0.9 % SODIUM CHLORIDE (POUR BTL) OPTIME
TOPICAL | Status: DC | PRN
  Administered 2012-02-09: 1000 mL

## 2012-02-09 MED ORDER — ARTIFICIAL TEARS OP OINT
TOPICAL_OINTMENT | OPHTHALMIC | Status: DC | PRN
  Administered 2012-02-09: 1 via OPHTHALMIC

## 2012-02-09 MED ORDER — HYDROMORPHONE HCL PF 1 MG/ML IJ SOLN
INTRAMUSCULAR | Status: AC
  Filled 2012-02-09: qty 1

## 2012-02-09 MED ORDER — CELECOXIB 200 MG PO CAPS
200.0000 mg | ORAL_CAPSULE | Freq: Two times a day (BID) | ORAL | Status: DC
  Administered 2012-02-09 – 2012-02-10 (×3): 200 mg via ORAL
  Filled 2012-02-09 (×4): qty 1

## 2012-02-09 MED ORDER — MENTHOL 3 MG MT LOZG
1.0000 | LOZENGE | OROMUCOSAL | Status: DC | PRN
  Filled 2012-02-09: qty 9

## 2012-02-09 MED ORDER — LACTATED RINGERS IV SOLN
INTRAVENOUS | Status: DC | PRN
  Administered 2012-02-09 (×3): via INTRAVENOUS

## 2012-02-09 MED ORDER — MORPHINE SULFATE 2 MG/ML IJ SOLN
1.0000 mg | INTRAMUSCULAR | Status: DC | PRN
  Administered 2012-02-09 (×2): 4 mg via INTRAVENOUS
  Filled 2012-02-09 (×2): qty 2

## 2012-02-09 MED ORDER — EPHEDRINE SULFATE 50 MG/ML IJ SOLN
INTRAMUSCULAR | Status: DC | PRN
  Administered 2012-02-09: 5 mg via INTRAVENOUS
  Administered 2012-02-09 (×3): 10 mg via INTRAVENOUS

## 2012-02-09 MED ORDER — SUCCINYLCHOLINE CHLORIDE 20 MG/ML IJ SOLN
INTRAMUSCULAR | Status: DC | PRN
  Administered 2012-02-09: 120 mg via INTRAVENOUS

## 2012-02-09 MED ORDER — AMLODIPINE BESYLATE 5 MG PO TABS
5.0000 mg | ORAL_TABLET | Freq: Every day | ORAL | Status: DC
  Administered 2012-02-10: 5 mg via ORAL
  Filled 2012-02-09: qty 1

## 2012-02-09 MED ORDER — OXYCODONE HCL 5 MG PO TABS: 5.0000 mg | ORAL_TABLET | Freq: Once | ORAL | Status: DC | PRN

## 2012-02-09 MED ORDER — ACETAMINOPHEN 10 MG/ML IV SOLN
INTRAVENOUS | Status: AC
  Administered 2012-02-09: 1000 mg via INTRAVENOUS
  Filled 2012-02-09: qty 100

## 2012-02-09 MED ORDER — SODIUM CHLORIDE 0.9 % IV SOLN
INTRAVENOUS | Status: AC
  Filled 2012-02-09: qty 500

## 2012-02-09 MED ORDER — CEFAZOLIN SODIUM 1-5 GM-% IV SOLN
1.0000 g | Freq: Three times a day (TID) | INTRAVENOUS | Status: AC
  Administered 2012-02-09 (×2): 1 g via INTRAVENOUS
  Filled 2012-02-09 (×2): qty 50

## 2012-02-09 MED ORDER — LIDOCAINE HCL (CARDIAC) 20 MG/ML IV SOLN
INTRAVENOUS | Status: DC | PRN
  Administered 2012-02-09: 100 mg via INTRAVENOUS

## 2012-02-09 MED ORDER — OXYCODONE HCL 5 MG/5ML PO SOLN: 5.0000 mg | Freq: Once | ORAL | Status: DC | PRN

## 2012-02-09 MED ORDER — ACETAMINOPHEN 650 MG RE SUPP: 650.0000 mg | RECTAL | Status: DC | PRN

## 2012-02-09 MED ORDER — METHOCARBAMOL 500 MG PO TABS
500.0000 mg | ORAL_TABLET | Freq: Four times a day (QID) | ORAL | Status: DC | PRN
  Administered 2012-02-09 – 2012-02-10 (×2): 500 mg via ORAL
  Filled 2012-02-09 (×3): qty 1

## 2012-02-09 MED ORDER — SODIUM CHLORIDE 0.9 % IR SOLN
Status: DC | PRN
  Administered 2012-02-09: 08:00:00

## 2012-02-09 MED ORDER — ATENOLOL 50 MG PO TABS
50.0000 mg | ORAL_TABLET | Freq: Every day | ORAL | Status: DC
  Administered 2012-02-10: 50 mg via ORAL
  Filled 2012-02-09: qty 1

## 2012-02-09 MED ORDER — ACETAMINOPHEN 10 MG/ML IV SOLN
1000.0000 mg | Freq: Four times a day (QID) | INTRAVENOUS | Status: DC
  Administered 2012-02-09: 1000 mg via INTRAVENOUS
  Filled 2012-02-09 (×4): qty 100

## 2012-02-09 MED ORDER — PROMETHAZINE HCL 25 MG/ML IJ SOLN: 6.2500 mg | INTRAMUSCULAR | Status: DC | PRN

## 2012-02-09 MED ORDER — POTASSIUM CHLORIDE IN NACL 20-0.9 MEQ/L-% IV SOLN
INTRAVENOUS | Status: DC
  Administered 2012-02-09: 13:00:00 via INTRAVENOUS
  Filled 2012-02-09 (×3): qty 1000

## 2012-02-09 MED ORDER — THROMBIN 5000 UNITS EX SOLR
OROMUCOSAL | Status: DC | PRN
  Administered 2012-02-09: 08:00:00 via TOPICAL

## 2012-02-09 MED ORDER — HYDROMORPHONE HCL PF 1 MG/ML IJ SOLN
0.2500 mg | INTRAMUSCULAR | Status: DC | PRN
  Administered 2012-02-09 (×4): 0.5 mg via INTRAVENOUS

## 2012-02-09 MED ORDER — PHENOL 1.4 % MT LIQD: 1.0000 | OROMUCOSAL | Status: DC | PRN

## 2012-02-09 MED ORDER — SODIUM CHLORIDE 0.9 % IJ SOLN: 3.0000 mL | INTRAMUSCULAR | Status: DC | PRN

## 2012-02-09 MED ORDER — THROMBIN 20000 UNITS EX KIT
PACK | CUTANEOUS | Status: DC | PRN
  Administered 2012-02-09: 20000 [IU] via TOPICAL

## 2012-02-09 MED ORDER — PROPOFOL 10 MG/ML IV BOLUS
INTRAVENOUS | Status: DC | PRN
  Administered 2012-02-09: 200 mg via INTRAVENOUS

## 2012-02-09 MED ORDER — OXYCODONE-ACETAMINOPHEN 5-325 MG PO TABS
1.0000 | ORAL_TABLET | ORAL | Status: DC | PRN
  Administered 2012-02-09 – 2012-02-10 (×4): 2 via ORAL
  Filled 2012-02-09 (×4): qty 2

## 2012-02-09 MED ORDER — GLYCOPYRROLATE 0.2 MG/ML IJ SOLN
INTRAMUSCULAR | Status: DC | PRN
  Administered 2012-02-09 (×2): 0.2 mg via INTRAVENOUS

## 2012-02-09 MED ORDER — SODIUM CHLORIDE 0.9 % IJ SOLN
3.0000 mL | Freq: Two times a day (BID) | INTRAMUSCULAR | Status: DC
  Administered 2012-02-09 – 2012-02-10 (×3): 3 mL via INTRAVENOUS

## 2012-02-09 MED ORDER — DEXAMETHASONE 4 MG PO TABS
4.0000 mg | ORAL_TABLET | Freq: Four times a day (QID) | ORAL | Status: DC
  Administered 2012-02-09 – 2012-02-10 (×3): 4 mg via ORAL
  Filled 2012-02-09 (×8): qty 1

## 2012-02-09 MED ORDER — HEMOSTATIC AGENTS (NO CHARGE) OPTIME
TOPICAL | Status: DC | PRN
  Administered 2012-02-09: 1 via TOPICAL

## 2012-02-09 MED ORDER — BACITRACIN 50000 UNITS IM SOLR
INTRAMUSCULAR | Status: AC
  Filled 2012-02-09: qty 1

## 2012-02-09 MED ORDER — DEXTROSE 5 % IV SOLN
500.0000 mg | Freq: Four times a day (QID) | INTRAVENOUS | Status: DC | PRN
  Administered 2012-02-09: 500 mg via INTRAVENOUS
  Filled 2012-02-09: qty 5

## 2012-02-09 MED ORDER — ONDANSETRON HCL 4 MG/2ML IJ SOLN
INTRAMUSCULAR | Status: DC | PRN
  Administered 2012-02-09: 4 mg via INTRAVENOUS

## 2012-02-09 MED ORDER — MIDAZOLAM HCL 5 MG/5ML IJ SOLN
INTRAMUSCULAR | Status: DC | PRN
  Administered 2012-02-09: 2 mg via INTRAVENOUS

## 2012-02-09 MED ORDER — FENTANYL CITRATE 0.05 MG/ML IJ SOLN
INTRAMUSCULAR | Status: DC | PRN
  Administered 2012-02-09 (×3): 50 ug via INTRAVENOUS
  Administered 2012-02-09: 150 ug via INTRAVENOUS
  Administered 2012-02-09: 50 ug via INTRAVENOUS

## 2012-02-09 MED ORDER — BUPIVACAINE HCL (PF) 0.25 % IJ SOLN
INTRAMUSCULAR | Status: DC | PRN
  Administered 2012-02-09: 5 mL
  Administered 2012-02-09: 10 mL

## 2012-02-09 MED ORDER — DEXAMETHASONE SODIUM PHOSPHATE 10 MG/ML IJ SOLN
10.0000 mg | INTRAMUSCULAR | Status: AC
  Administered 2012-02-09: 10 mg via INTRAVENOUS

## 2012-02-09 MED ORDER — ACETAMINOPHEN 325 MG PO TABS: 650.0000 mg | ORAL_TABLET | ORAL | Status: DC | PRN

## 2012-02-09 MED ORDER — DEXAMETHASONE SODIUM PHOSPHATE 4 MG/ML IJ SOLN
4.0000 mg | Freq: Four times a day (QID) | INTRAMUSCULAR | Status: DC
  Administered 2012-02-09: 4 mg via INTRAVENOUS
  Filled 2012-02-09 (×5): qty 1

## 2012-02-09 MED ORDER — SODIUM CHLORIDE 0.9 % IV SOLN: 250.0000 mL | INTRAVENOUS | Status: DC

## 2012-02-09 MED ORDER — LIDOCAINE HCL 4 % MT SOLN
OROMUCOSAL | Status: DC | PRN
  Administered 2012-02-09: 4 mL via TOPICAL

## 2012-02-09 SURGICAL SUPPLY — 62 items
5.0 x 35 Screw ×4 IMPLANT
5.5 Rods (Neuro Prosthesis/Implant) ×4 IMPLANT
BAG DECANTER FOR FLEXI CONT (MISCELLANEOUS) ×2 IMPLANT
BENZOIN TINCTURE PRP APPL 2/3 (GAUZE/BANDAGES/DRESSINGS) ×2 IMPLANT
BLADE SURG ROTATE 9660 (MISCELLANEOUS) ×4 IMPLANT
BONE MATRIX OSTEOCEL PLUS 5CC (Bone Implant) ×2 IMPLANT
BUR MATCHSTICK NEURO 3.0 LAGG (BURR) ×2 IMPLANT
CAGE COROENT MP 8X23 (Cage) ×4 IMPLANT
CANISTER SUCTION 2500CC (MISCELLANEOUS) ×2 IMPLANT
CLIP NEUROVISION LG (CLIP) ×2 IMPLANT
CLOTH BEACON ORANGE TIMEOUT ST (SAFETY) ×2 IMPLANT
CONT SPEC 4OZ CLIKSEAL STRL BL (MISCELLANEOUS) ×4 IMPLANT
COVER BACK TABLE 24X17X13 BIG (DRAPES) IMPLANT
COVER TABLE BACK 60X90 (DRAPES) ×2 IMPLANT
DRAPE C-ARM 42X72 X-RAY (DRAPES) ×2 IMPLANT
DRAPE C-ARMOR (DRAPES) ×4 IMPLANT
DRAPE LAPAROTOMY 100X72X124 (DRAPES) ×2 IMPLANT
DRAPE POUCH INSTRU U-SHP 10X18 (DRAPES) ×2 IMPLANT
DRAPE SURG 17X23 STRL (DRAPES) ×2 IMPLANT
DRESSING TELFA 8X3 (GAUZE/BANDAGES/DRESSINGS) ×2 IMPLANT
DRSG OPSITE 4X5.5 SM (GAUZE/BANDAGES/DRESSINGS) ×2 IMPLANT
DURAPREP 26ML APPLICATOR (WOUND CARE) ×2 IMPLANT
ELECT REM PT RETURN 9FT ADLT (ELECTROSURGICAL) ×2
ELECTRODE REM PT RTRN 9FT ADLT (ELECTROSURGICAL) ×1 IMPLANT
EVACUATOR 1/8 PVC DRAIN (DRAIN) ×2 IMPLANT
GAUZE SPONGE 4X4 16PLY XRAY LF (GAUZE/BANDAGES/DRESSINGS) IMPLANT
GLOVE BIO SURGEON STRL SZ8 (GLOVE) ×6 IMPLANT
GLOVE BIOGEL PI IND STRL 7.0 (GLOVE) ×2 IMPLANT
GLOVE BIOGEL PI INDICATOR 7.0 (GLOVE) ×2
GLOVE INDICATOR 8.5 STRL (GLOVE) ×2 IMPLANT
GLOVE SS BIOGEL STRL SZ 6.5 (GLOVE) ×2 IMPLANT
GLOVE SUPERSENSE BIOGEL SZ 6.5 (GLOVE) ×2
GOWN BRE IMP SLV AUR LG STRL (GOWN DISPOSABLE) ×2 IMPLANT
GOWN BRE IMP SLV AUR XL STRL (GOWN DISPOSABLE) ×6 IMPLANT
GOWN STRL REIN 2XL LVL4 (GOWN DISPOSABLE) IMPLANT
HEADS TULIP (Orthopedic Implant) ×4 IMPLANT
HEMOSTAT POWDER KIT SURGIFOAM (HEMOSTASIS) ×2 IMPLANT
KIT BASIN OR (CUSTOM PROCEDURE TRAY) ×2 IMPLANT
KIT NEEDLE NVM5 EMG ELECT (KITS) ×1 IMPLANT
KIT NEEDLE NVM5 EMG ELECTRODE (KITS) ×1
KIT ROOM TURNOVER OR (KITS) ×2 IMPLANT
MILL MEDIUM DISP (BLADE) ×2 IMPLANT
NEEDLE HYPO 25X1 1.5 SAFETY (NEEDLE) ×2 IMPLANT
NS IRRIG 1000ML POUR BTL (IV SOLUTION) ×2 IMPLANT
PACK LAMINECTOMY NEURO (CUSTOM PROCEDURE TRAY) ×2 IMPLANT
PAD ARMBOARD 7.5X6 YLW CONV (MISCELLANEOUS) ×6 IMPLANT
SCREW LOCK 5.5MM ROD (Screw) ×8 IMPLANT
SCREW SHANK MASTLIS 5.5X30 (Screw) ×2 IMPLANT
SCREW SHANK MASTLIS 5.5X35 (Screw) ×2 IMPLANT
SPONGE LAP 4X18 X RAY DECT (DISPOSABLE) IMPLANT
SPONGE SURGIFOAM ABS GEL 100 (HEMOSTASIS) ×2 IMPLANT
STRIP CLOSURE SKIN 1/2X4 (GAUZE/BANDAGES/DRESSINGS) ×2 IMPLANT
SUT VIC AB 0 CT1 18XCR BRD8 (SUTURE) ×1 IMPLANT
SUT VIC AB 0 CT1 8-18 (SUTURE) ×1
SUT VIC AB 2-0 CP2 18 (SUTURE) ×2 IMPLANT
SUT VIC AB 3-0 SH 8-18 (SUTURE) ×4 IMPLANT
SYR 20ML ECCENTRIC (SYRINGE) ×2 IMPLANT
TOWEL OR 17X24 6PK STRL BLUE (TOWEL DISPOSABLE) ×2 IMPLANT
TOWEL OR 17X26 10 PK STRL BLUE (TOWEL DISPOSABLE) ×2 IMPLANT
TRAP SPECIMEN MUCOUS 40CC (MISCELLANEOUS) ×2 IMPLANT
TRAY FOLEY CATH 14FRSI W/METER (CATHETERS) ×2 IMPLANT
WATER STERILE IRR 1000ML POUR (IV SOLUTION) ×2 IMPLANT

## 2012-02-09 NOTE — Anesthesia Preprocedure Evaluation (Addendum)
Anesthesia Evaluation  Patient identified by MRN, date of birth, ID band Patient awake    Reviewed: Allergy & Precautions, H&P , NPO status , Patient's Chart, lab work & pertinent test results  Airway Mallampati: II TM Distance: >3 FB Neck ROM: Full    Dental   Pulmonary neg pulmonary ROS,    Pulmonary exam normal       Cardiovascular hypertension, Pt. on home beta blockers Rhythm:Regular Rate:Normal     Neuro/Psych negative psych ROS   GI/Hepatic Neg liver ROS, GERD-  Medicated,  Endo/Other  negative endocrine ROS  Renal/GU negative Renal ROS     Musculoskeletal   Abdominal   Peds  Hematology   Anesthesia Other Findings   Reproductive/Obstetrics                          Anesthesia Physical Anesthesia Plan  ASA: II  Anesthesia Plan: General   Post-op Pain Management:    Induction: Intravenous  Airway Management Planned: Oral ETT  Additional Equipment:   Intra-op Plan:   Post-operative Plan: Extubation in OR  Informed Consent: I have reviewed the patients History and Physical, chart, labs and discussed the procedure including the risks, benefits and alternatives for the proposed anesthesia with the patient or authorized representative who has indicated his/her understanding and acceptance.   Dental advisory given  Plan Discussed with: CRNA, Anesthesiologist and Surgeon  Anesthesia Plan Comments:        Anesthesia Quick Evaluation

## 2012-02-09 NOTE — Evaluation (Signed)
Physical Therapy Evaluation Patient Details Name: Andrew Suarez MRN: [id number] DOB: [birth year] Today's Date: 02/09/2012 Time: 4098-1191 PT Time Calculation (min): 27 min  PT Assessment / Plan / Recommendation Clinical Impression  Pt is a pleasent 56 y.o. male s/p lumbar fusion. patient is very steady and at supervision mainly for inital cues with precautions. Anticipate patient will be at independent within next session.  Will perform stair negotiation and car transfer in the am in preparation for d/c home. Patient will benefit from one more session with no home health needs.    PT Assessment  Patient needs continued PT services    Follow Up Recommendations  No PT follow up    Does the patient have the potential to tolerate intense rehabilitation      Barriers to Discharge None      Equipment Recommendations  Rolling walker with 5" wheels    Recommendations for Other Services     Frequency Min 5X/week    Precautions / Restrictions Precautions Precautions: Back Precaution Booklet Issued: Yes (comment) Precaution Comments: educated on precautions Required Braces or Orthoses: Spinal Brace Spinal Brace: Lumbar corset Restrictions Weight Bearing Restrictions: No   Pertinent Vitals/Pain 3-4/10      Mobility  Bed Mobility Bed Mobility: Rolling Right;Right Sidelying to Sit;Sitting - Scoot to [place] of Bed;Sit to Sidelying Right;Rolling Left;Scooting to [facility] Rolling Right: 5: Supervision Rolling Left: 5: Supervision Right Sidelying to Sit: 5: Supervision Sitting - Scoot to Edge of Bed: 5: Supervision Sit to Sidelying Right: 5: Supervision Scooting to [facility]: 5: Supervision Details for Bed Mobility Assistance: VC's for tehcnique with precautions Transfers Transfers: Sit to Stand;Stand to Sit Sit to Stand: 5: Supervision Stand to Sit: 5: Supervision Details for Transfer Assistance: VC's for hand placement Ambulation/Gait Ambulation/Gait Assistance: 5:  Supervision Ambulation Distance (Feet): 400 Feet Assistive device: Rolling walker Ambulation/Gait Assistance Details: VC's for upright posture and minimal cues for precautions Gait Pattern: Step-through pattern Gait velocity: WFL General Gait Details: Patient is steady with ambulation Stairs: No           PT Diagnosis: Acute pain  PT Problem List: Decreased mobility;Decreased activity tolerance;Pain PT Treatment Interventions: DME instruction;Gait training;Stair training;Functional mobility training   PT Goals Acute Rehab PT Goals PT Goal Formulation: With patient/family Time For Goal Achievement: 02/09/12 Potential to Achieve Goals: Good Pt will go Supine/Side to Sit: with modified independence PT Goal: Supine/Side to Sit - Progress: Goal set today Pt will go Sit to Supine/Side: with modified independence PT Goal: Sit to Supine/Side - Progress: Goal set today Pt will Stand: with modified independence PT Goal: Stand - Progress: Goal set today Pt will Ambulate: >150 feet;with modified independence;with least restrictive assistive device PT Goal: Ambulate - Progress: Goal set today Pt will Go Up / Down Stairs: 3-5 stairs;with modified independence PT Goal: Up/Down Stairs - Progress: Goal set today  Visit Information  Last PT Received On: 02/09/12 Assistance Needed: +1    Subjective Data  Subjective: I have dinner reservations; I wanna get out of here as soon as I can Patient Stated Goal: to go home   Prior Functioning  Home Living Lives With: Spouse Available Help at Discharge: Family Type of Home: House Home Access: Stairs to enter Secretary/administrator of Steps: 2 Entrance Stairs-Rails: None Home Layout: One level Bathroom Shower/Tub: Health visitor: Standard Home Adaptive Equipment: Straight cane;Walker - rolling Prior Function Level of Independence: Independent Able to Take Stairs?: Yes Driving: Yes 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[employer]) Communication Communication: No difficulties Dominant Hand: Right    Cognition  Overall Cognitive Status: Appears within functional limits for tasks assessed/performed Arousal/Alertness: Awake/alert Orientation Level: Appears intact for tasks assessed;Oriented X4 / Intact Behavior During Session: [facility] for tasks performed    Extremity/Trunk Assessment Right Upper Extremity Assessment RUE ROM/Strength/Tone: Deficits;Due to pain RUE Sensation: History of peripheral neuropathy (possibly neck vs carpel tunnel) Left Upper Extremity Assessment LUE ROM/Strength/Tone: WFL for tasks assessed Right Lower Extremity Assessment RLE ROM/Strength/Tone: [facility] for tasks assessed Left Lower Extremity Assessment LLE ROM/Strength/Tone: WFL for tasks assessed   Balance Balance Balance Assessed: Yes High Level Balance High Level Balance Activites: Side stepping;Turns;Sudden stops;Head turns;Backward walking;Direction changes High Level Balance Comments: Patient very steady with all aspects of balance  End of Session PT - End of Session Equipment Utilized During Treatment: Gait belt;Back brace Activity Tolerance: Patient tolerated treatment well Patient left: in bed;with call bell/phone within reach;with bed alarm set;with family/visitor present Nurse Communication: Mobility status  GP     Fabio Asa 02/09/2012, 3:43 PM  Charlotte Crumb, PT DPT  [phone]

## 2012-02-09 NOTE — Preoperative (Signed)
Beta Blockers   Reason not to administer Beta Blockers:Not Applicable 

## 2012-02-09 NOTE — Transfer of Care (Signed)
Immediate Anesthesia Transfer of Care Note  Patient: Andrew Suarez  Procedure(s) Performed: Procedure(s) (LRB) with comments: POSTERIOR LUMBAR FUSION 1 LEVEL (N/A) - Maximum Access Surgery Posterior Lumbar Interbody Fusion, Lumbar Four-Five  Patient Location: PACU  Anesthesia Type:General  Level of Consciousness: awake, alert  and oriented  Airway & Oxygen Therapy: Patient Spontanous Breathing and Patient connected to face mask oxygen  Post-op Assessment: Report given to PACU RN  Post vital signs: Reviewed and stable  Complications: No apparent anesthesia complications

## 2012-02-09 NOTE — H&P (Signed)
Subjective: Patient is a 56 y.o. male admitted for PLIF L4-5. Onset of symptoms was several months ago, gradually worsening since that time.  The pain is rated severe, and is located at the across the lower back and radiates to right buttocks and right thigh. The pain is described as aching and throbbing and occurs all day. The symptoms have been progressive. Symptoms are exacerbated by exercise and standing. MRI or CT showed degenerative disc disease with spondylolisthesis and spondylosis L4-5.   Past Medical History  Diagnosis Date  . Toxoplasmosis     left neck  . Hypertension     does not see a cardiolgist  . Hepatitis A   . GERD (gastroesophageal reflux disease)     Past Surgical History  Procedure Date  . Total knee arthroplasty     left  . Inguinal hernia repair     left  . Rotator cuff repair     left   . Arthoscopy     knee  . Elbow surgery     bone removal  . Neck fusion   . Back surgery     Prior to Admission medications   Medication Sig Start Date End Date Taking? Authorizing Provider  amLODipine (NORVASC) 5 MG tablet Take 5 mg by mouth daily.   Yes Historical Provider, MD  atenolol (TENORMIN) 50 MG tablet Take 50 mg by mouth daily.   Yes Historical Provider, MD  HYDROcodone-acetaminophen (NORCO/VICODIN) 5-325 MG per tablet Take 1 tablet by mouth every 8 (eight) hours as needed. For pain   Yes Historical Provider, MD  ibuprofen (ADVIL,MOTRIN) 200 MG tablet Take 200-400 mg by mouth every 6 (six) hours as needed. For pain   Yes Historical Provider, MD  Multiple Vitamin (MULTIVITAMIN WITH MINERALS) TABS Take 1 tablet by mouth daily.   Yes Historical Provider, MD  omeprazole (PRILOSEC OTC) 20 MG tablet Take 20 mg by mouth daily as needed. For heartburn   Yes Historical Provider, MD   No Known Allergies  History  Substance Use Topics  . Smoking status: Never Smoker   . Smokeless tobacco: Never Used  . Alcohol Use: 27.0 oz/week    45 Glasses of wine per week   Comment: 4-5 glasses of wine a night    History reviewed. No pertinent family history.   Review of Systems  Positive ROS: neg  All other systems have been reviewed and were otherwise negative with the exception of those mentioned in the HPI and as above.  Objective: Vital signs in last 24 hours: Temp:  [98.4 F (36.9 C)] 98.4 F (36.9 C) (12/20 0624) Pulse Rate:  [54] 54  (12/20 0624) Resp:  [18] 18  (12/20 0624) BP: (148)/(88) 148/88 mmHg (12/20 0624) SpO2:  [99 %] 99 % (12/20 0624)  General Appearance: Alert, cooperative, no distress, appears stated age Head: Normocephalic, without obvious abnormality, atraumatic Eyes: PERRL, conjunctiva/corneas clear, EOM's intact, fundi benign, both eyes      Ears: Normal TM's and external ear canals, both ears Throat: Lips, mucosa, and tongue normal; teeth and gums normal Neck: Supple, symmetrical, trachea midline, no adenopathy; thyroid: No enlargement/tenderness/nodules; no carotid bruit or JVD Back: Symmetric, no curvature, ROM normal, no CVA tenderness Lungs: Clear to auscultation bilaterally, respirations unlabored Heart: Regular rate and rhythm, S1 and S2 normal, no murmur, rub or gallop Abdomen: Soft, non-tender, bowel sounds active all four quadrants, no masses, no organomegaly Extremities: Extremities normal, atraumatic, no cyanosis or edema Pulses: 2+ and symmetric all extremities Skin: Skin  color, texture, turgor normal, no rashes or lesions  NEUROLOGIC:   Mental status: Alert and oriented x4,  no aphasia, good attention span, fund of knowledge, and memory Motor Exam - grossly normal Sensory Exam - grossly normal Reflexes: 1+ Coordination - grossly normal Gait - grossly normal Balance - grossly normal Cranial Nerves: I: smell Not tested  II: visual acuity  OS: nl    OD: nl  II: visual fields Full to confrontation  II: pupils Equal, round, reactive to light  III,VII: ptosis None  III,IV,VI: extraocular muscles  Full ROM   V: mastication Normal  V: facial light touch sensation  Normal  V,VII: corneal reflex  Present  VII: facial muscle function - upper  Normal  VII: facial muscle function - lower Normal  VIII: hearing Not tested  IX: soft palate elevation  Normal  IX,X: gag reflex Present  XI: trapezius strength  5/5  XI: sternocleidomastoid strength 5/5  XI: neck flexion strength  5/5  XII: tongue strength  Normal    Data Review Lab Results  Component Value Date   WBC 6.8 02/01/2012   HGB 13.4 02/01/2012   HCT 38.2* 02/01/2012   MCV 95.0 02/01/2012   PLT 214 02/01/2012   Lab Results  Component Value Date   NA 134* 02/01/2012   K 4.3 02/01/2012   CL 97 02/01/2012   CO2 26 02/01/2012   BUN 16 02/01/2012   CREATININE 1.17 02/01/2012   GLUCOSE 102* 02/01/2012   Lab Results  Component Value Date   INR 1.02 02/01/2012    Assessment/Plan: Patient admitted for MASPLIF L4-5. Patient has failed conservative therapy.  I explained the condition and procedure to the patient and answered any questions.  Patient wishes to proceed with procedure as planned. Understands risks/ benefits and typical outcomes of procedure.   Kingdom Vanzanten S 02/09/2012 7:12 AM

## 2012-02-09 NOTE — Anesthesia Postprocedure Evaluation (Signed)
Anesthesia Post Note  Patient: Andrew Suarez  Procedure(s) Performed: Procedure(s) (LRB): POSTERIOR LUMBAR FUSION 1 LEVEL (N/A)  Anesthesia type: general  Patient location: PACU  Post pain: Pain level controlled  Post assessment: Patient's Cardiovascular Status Stable  Last Vitals:  Filed Vitals:   02/09/12 1145  BP:   Pulse:   Temp: 36.3 C  Resp:     Post vital signs: Reviewed and stable  Level of consciousness: sedated  Complications: No apparent anesthesia complications

## 2012-02-09 NOTE — OR Nursing (Signed)
Neuro Monitoring provided by [facility] Diones, needles inserted by RN with place assistance from [employer] Rep. Sites were assessed and had minimal bleeding or were fine after removal by RN.

## 2012-02-09 NOTE — Op Note (Signed)
02/09/2012  10:25 AM  PATIENT:  Andrew Suarez  56 y.o. male  PRE-OPERATIVE DIAGNOSIS:  Lumbar spondylosis with stenosis and spondylolisthesis L4-5 with back and right leg pain  POST-OPERATIVE DIAGNOSIS:  same  PROCEDURE:   1. Decompressive lumbar laminectomy L4-5 requiring more work than would be required of the typical PLIF procedure in order to adequately decompress the neural elements.  2. Posterior lumbar interbody fusion L4-5 using PEEK interbody cages packed with morcellized allograft and autograft.  3. Posterior fixation L4-5 using cortical pedicle screws.    SURGEON:  Marikay Alar, MD  ASSISTANTS: Dr. Wynetta Emery  ANESTHESIA:  General  EBL: 200 ml  Total I/O In: 2000 [I.V.:2000] Out: 800 [Urine:600; Blood:200]  BLOOD ADMINISTERED:none  DRAINS: Hemovac   INDICATION FOR PROCEDURE: This patient has a long history of back and right leg pain. He had a previous extraforaminal discectomy L4-5 on  the right. MRI and CT scan showed spondylolisthesis with spinal stenosis and spondylosis at L4-5. He still had disease at L1-2 and L2-3 but I felt that he was more likely have symptoms from L4-5. He tried medical management question, without significant relief. Recommended a decompression and fusion at L4-5. Patient understood the risks, benefits, and alternatives and potential outcomes and wished to proceed.  PROCEDURE DETAILS:  The patient was brought to the operating room. After induction of generalized endotracheal anesthesia the patient was rolled into the prone position on chest rolls and all pressure points were padded. The patient's lumbar region was cleaned and then prepped with DuraPrep and draped in the usual sterile fashion. Anesthesia was injected and then a dorsal midline incision was made and carried down to the lumbosacral fascia. The fascia was opened and the paraspinous musculature was taken down in a subperiosteal fashion to expose L4-5. Intraoperative fluoroscopy confirmed  my level, and I turned my attention to the placement of the L4 cortical pedicle screws. Utilizing AP fluoroscopy the entry zone to the pedicle was identified and a small burr hole was placed and then drilled and tapped with a hand drill and tap. We palpated to make sure there is no break in the cortex. We then placed 50 by 30 mm pedicle screws and a medial to lateral direction and the L4 pedicles . Then turned my attention to the PLIF and the spinous process was removed and complete lumbar laminectomies, hemi- facetectomies, and foraminotomies were performed at L4-5. The yellow ligament was removed to expose the underlying dura and nerve roots, and generous foraminotomies were performed to adequately decompress the neural elements. Once the decompression was complete, I turned my attention to the posterior lower lumbar interbody fusion. The epidural venous vasculature was coagulated and cut sharply. Disc space was incised and the initial discectomy was performed with pituitary rongeurs. The disc space was distracted with sequential distractors to a height of 8 mm. We then used a series of scrapers and shavers to prepare the endplates for fusion. The midline was prepared with Epstein curettes. Once the complete discectomy was finished, we packed an appropriate sized peek interbody cage with local autograft and morcellized allograft, gently retracted the nerve root, and tapped the cage into position at L4-5.  The midline was packed with morselized autograft and allograft. We then turned our attention to the posterior fixation at L5. The pedicle screw entry zones were identified utilizing surface landmarks and fluoroscopy. We probed each pedicle with the pedicle probe and tapped each pedicle with the appropriate tap. We palpated with a ball probe to assure  no break in the cortex. We then placed 5 5 x 35 mm pedicle screws into the pedicles bilaterally at L5.  We then placed lordotic rods into the multiaxial screw heads  of the pedicle screws and locked these in position with the locking caps and anti-torque device. We then checked our construct with AP and lateral fluoroscopy. Irrigated with copious amounts of bacitracin-containing saline solution. Placed a medium Hemovac drain through separate stab incision. Inspected the nerve roots once again to assure adequate decompression, lined to the dura with Gelfoam, and closed the muscle and the fascia with 0 Vicryl. Closed the subcutaneous tissues with 2-0 Vicryl and subcuticular tissues with 3-0 Vicryl. The skin was closed with benzoin and Steri-Strips. Dressing was then applied, the patient was awakened from general anesthesia and transported to the recovery room in stable condition. At the end of the procedure all sponge, needle and instrument counts were correct.   PLAN OF CARE: Admit to inpatient   PATIENT DISPOSITION:  PACU - hemodynamically stable.   Delay start of Pharmacological VTE agent (>24hrs) due to surgical blood loss or risk of bleeding:  yes

## 2012-02-10 NOTE — Discharge Summary (Signed)
Physician Discharge Summary  Patient ID: ALISTAIR SENFT MRN: [id number] DOB/AGE: Aug 26, 1955 56 y.o.  Admit date: 02/09/2012 Discharge date: 02/10/2012  Admission Diagnoses:Lumbar ddd L4/5  Discharge Diagnoses: Lumbar ddd L4/5 Active Problems:  * No active hospital problems. *    Discharged Condition: good  Hospital Course: Mr. Hawes was admitted to the hospital and underwent a 1. Decompressive lumbar laminectomy L4-5 requiring more work than would be required of the typical PLIF procedure in order to adequately decompress the neural elements.  2. Posterior lumbar interbody fusion L4-5 using PEEK interbody cages packed with morcellized allograft and autograft.  3. Posterior fixation L4-5 using cortical pedicle screws.  Post operatively he has walked, voided, and been able to eat.  At discharge wound is clean, dry, and without signs of infection.  Consults: None  Significant Diagnostic Studies:   Treatments: surgery: 1. Decompressive lumbar laminectomy L4-5 requiring more work than would be required of the typical PLIF procedure in order to adequately decompress the neural elements.  2. Posterior lumbar interbody fusion L4-5 using PEEK interbody cages packed with morcellized allograft and autograft.  3. Posterior fixation L4-5 using cortical pedicle screws.    Discharge Exam: Blood pressure 115/62, pulse 57, temperature 97.9 F (36.6 C), temperature source Oral, resp. rate 18, height 5\' 6"  (1.676 m), weight 75.297 kg (166 lb), SpO2 100.00%. General appearance: alert, cooperative, appears stated age and no distress Neurologic: Alert and oriented X 3, normal strength and tone. Normal symmetric reflexes. Normal coordination and gait  Disposition:      Medication List     As of 02/10/2012 10:45 AM    TAKE these medications         amLODipine 5 MG tablet   Commonly known as: NORVASC   Take 5 mg by mouth daily.      atenolol 50 MG tablet   Commonly known as: TENORMIN   Take 50 mg by mouth daily.      HYDROcodone-acetaminophen 5-325 MG per tablet   Commonly known as: NORCO/VICODIN   Take 1 tablet by mouth every 8 (eight) hours as needed. For pain      ibuprofen 200 MG tablet   Commonly known as: ADVIL,MOTRIN   Take 200-400 mg by mouth every 6 (six) hours as needed. For pain      multivitamin with minerals Tabs   Take 1 tablet by mouth daily.      omeprazole 20 MG tablet   Commonly known as: PRILOSEC OTC   Take 20 mg by mouth daily as needed. For heartburn         Signed: Brandis Wixted L 02/10/2012, 10:45 AM

## 2012-02-10 NOTE — Progress Notes (Signed)
Physical Therapy Treatment Patient Details Name: Andrew Suarez MRN: [id number] DOB: 1955/06/10 Today's Date: 02/10/2012 Time: 1006-1020 PT Time Calculation (min): 14 min  PT Assessment / Plan / Recommendation Comments on Treatment Session  Pt moving extremely well at this date.   Pt is at (S)/Mod (I) for mobility & ambulating entire 4N unit with no AD at this time & ascended/descended steps with no Rail + (S) for safety only.  Pt mobilizing at level to safely d/c home when MD feels approp.  PT services are now signing off.      Follow Up Recommendations  No PT follow up     Does the patient have the potential to tolerate intense rehabilitation     Barriers to Discharge        Equipment Recommendations  Rolling walker with 5" wheels    Recommendations for Other Services    Frequency Min 5X/week   Plan Discharge plan remains appropriate    Precautions / Restrictions Precautions Precautions: Back Precaution Comments: Pt recalled 3/3 back precautions & did well adhering to precautions during mobility Required Braces or Orthoses: Spinal Brace Spinal Brace: Lumbar corset Restrictions Weight Bearing Restrictions: No   Pertinent Vitals/Pain "It's fine" in Re: to back.      Mobility  Bed Mobility Bed Mobility: Sit to Sidelying Right;Scooting to HOB Sit to Sidelying Right: 6: Modified independent (Device/Increase time) Scooting to [facility]: 6: Modified independent (Device/Increase time) Transfers Transfers: Stand to Sit Stand to Sit: 6: Modified independent (Device/Increase time);With upper extremity assist;To bed Ambulation/Gait Ambulation/Gait Assistance: 5: Supervision Ambulation Distance (Feet): 500 Feet (2x's.  4N unit 2x's.  ) Assistive device: None Ambulation/Gait Assistance Details: Pt ambulated entire 4N unit 2x's with no AD.  Very steady.  Tolerated directional changes, head turns in all directions, & gait speed changes with no LOB.   Encouraged pt to use either walker or  SPC if he was ambulating by himself but also encouraged him to have (S) initially.   Gait Pattern: Within Functional Limits Gait velocity: WFL Stairs: Yes Stairs Assistance: 5: Supervision Stairs Assistance Details (indicate cue type and reason): (S) for safety but pt performed well.   Stair Management Technique: No rails;Alternating pattern;Forwards Number of Stairs: 5       PT Goals Acute Rehab PT Goals Time For Goal Achievement: 02/09/12 Potential to Achieve Goals: Good Pt will go Supine/Side to Sit: with modified independence Pt will go Sit to Supine/Side: with modified independence PT Goal: Sit to Supine/Side - Progress: Met Pt will Stand: with modified independence Pt will Ambulate: >150 feet;with modified independence;with least restrictive assistive device PT Goal: Ambulate - Progress: Progressing toward goal Pt will Go Up / Down Stairs: 3-5 stairs;with modified independence PT Goal: Up/Down Stairs - Progress: Progressing toward goal  Visit Information  Last PT Received On: 02/10/12 Assistance Needed: +1    Subjective Data  Patient Stated Goal: to go home   Cognition  Overall Cognitive Status: Appears within functional limits for tasks assessed/performed Arousal/Alertness: Awake/alert Orientation Level: Appears intact for tasks assessed;Oriented X4 / Intact Behavior During Session: [facility] for tasks performed    Balance     End of Session PT - End of Session Equipment Utilized During Treatment: Back brace Activity Tolerance: Patient tolerated treatment well Patient left: in bed;with call bell/phone within reach Nurse Communication: Mobility status     Verdell Face, [place] [phone] 02/10/2012

## 2012-02-10 NOTE — Progress Notes (Signed)
Agree with discharge plan 02/10/2012 Milana Kidney DPT PAGER: [phone] OFFICE: [phone]

## 2012-02-12 MED FILL — Sodium Chloride IV Soln 0.9%: INTRAVENOUS | Qty: 1000 | Status: AC

## 2012-02-12 MED FILL — Sodium Chloride Irrigation Soln 0.9%: Qty: 3000 | Status: AC

## 2012-02-12 MED FILL — Heparin Sodium (Porcine) Inj 1000 Unit/ML: INTRAMUSCULAR | Qty: 30 | Status: AC

## 2012-02-12 NOTE — Care Management Note (Signed)
    Page 1 of 1   02/12/2012     8:15:45 AM   CARE MANAGEMENT NOTE 02/12/2012  Patient:  Andrew Suarez, Andrew Suarez   Account Number:  [id number]  Date Initiated:  02/09/2012  Documentation initiated by:  [facility]  Subjective/Objective Assessment:   Admitted postop PLIF L4-5     Action/Plan:   PT eval-no follow up recommended   Anticipated DC Date:  02/12/2012   Anticipated DC Plan:  HOME/SELF CARE      DC Planning Services  CM consult      Choice offered to / List presented to:             Status of service:  Completed, signed off Medicare Important Message given?   (If response is "NO", the following Medicare IM given date fields will be blank) Date Medicare IM given:   Date Additional Medicare IM given:    Discharge Disposition:  HOME/SELF CARE  Per UR Regulation:  Reviewed for med. necessity/level of care/duration of stay  If discussed at Long Length of Stay Meetings, dates discussed:    Comments:

## 2012-07-04 ENCOUNTER — Other Ambulatory Visit: Payer: Self-pay | Admitting: Neurological Surgery

## 2012-07-04 DIAGNOSIS — M545 Low back pain, unspecified: Secondary | ICD-10-CM

## 2012-07-08 ENCOUNTER — Ambulatory Visit
Admission: RE | Admit: 2012-07-08 | Discharge: 2012-07-08 | Disposition: A | Discharge status: Home / Self Care | Payer: BC Managed Care – PPO | Source: Ambulatory Visit | Attending: Neurological Surgery | Admitting: Neurological Surgery

## 2012-07-08 DIAGNOSIS — M545 Low back pain, unspecified: Secondary | ICD-10-CM

## 2013-06-17 ENCOUNTER — Other Ambulatory Visit: Payer: Self-pay | Admitting: Neurological Surgery

## 2013-06-17 DIAGNOSIS — M545 Low back pain, unspecified: Secondary | ICD-10-CM

## 2013-06-26 ENCOUNTER — Ambulatory Visit
Admission: RE | Admit: 2013-06-26 | Discharge: 2013-06-26 | Disposition: A | Discharge status: Home / Self Care | Payer: BC Managed Care – PPO | Source: Ambulatory Visit | Attending: Neurological Surgery | Admitting: Neurological Surgery

## 2013-06-26 VITALS — BP 133/77 | HR 63

## 2013-06-26 DIAGNOSIS — M545 Low back pain, unspecified: Secondary | ICD-10-CM

## 2013-06-26 MED ORDER — IOHEXOL 180 MG/ML  SOLN
15.0000 mL | Freq: Once | INTRAMUSCULAR | Status: AC | PRN
  Administered 2013-06-26: 15 mL via INTRATHECAL

## 2013-06-26 MED ORDER — DIAZEPAM 5 MG PO TABS
10.0000 mg | ORAL_TABLET | Freq: Once | ORAL | Status: AC
  Administered 2013-06-26: 10 mg via ORAL

## 2013-06-26 NOTE — Discharge Instructions (Addendum)
Myelogram Discharge Instructions  1. Go home and rest quietly for the next 24 hours.  It is important to lie flat for the next 24 hours.  Get up only to go to the restroom.  You may lie in the bed or on a couch on your back, your stomach, your left side or your right side.  You may have one pillow under your head.  You may have pillows between your knees while you are on your side or under your knees while you are on your back.  2. DO NOT drive today.  Recline the seat as far back as it will go, while still wearing your seat belt, on the way home.  3. You may get up to go to the bathroom as needed.  You may sit up for 10 minutes to eat.  You may resume your normal diet and medications unless otherwise indicated.  Drink lots of extra fluids today and tomorrow.  4. The incidence of headache, nausea, or vomiting is about 5% (one in 20 patients).  If you develop a headache, lie flat and drink plenty of fluids until the headache goes away.  Caffeinated beverages may be helpful.  If you develop severe nausea and vomiting or a headache that does not go away with flat bed rest, call [phone].  5. You may resume normal activities after your 24 hours of bed rest is over; however, do not exert yourself strongly or do any heavy lifting tomorrow. If when you get up you have a headache when standing, go back to bed and force fluids for another 24 hours.  6. Call your physician for a follow-up appointment.  The results of your myelogram will be sent directly to your physician by the following day.  7. If you have any questions or if complications develop after you arrive home, please call [phone].  Discharge instructions have been explained to the patient.  The patient, or the person responsible for the patient, fully understands these instructions.      May resume Wellbutrin on Jun 27, 2013, after 11:00 am,

## 2013-06-26 NOTE — Progress Notes (Signed)
Pt states he has been off Wellbutrin for the past 2 days. Discharge instructions explained to pt. 

## 2013-09-12 ENCOUNTER — Other Ambulatory Visit: Payer: Self-pay | Admitting: Neurological Surgery

## 2013-09-29 ENCOUNTER — Encounter (HOSPITAL_COMMUNITY)
Admission: RE | Admit: 2013-09-29 | Discharge: 2013-09-29 | Disposition: A | Discharge status: Home / Self Care | Payer: BC Managed Care – PPO | Source: Ambulatory Visit | Attending: Neurological Surgery | Admitting: Neurological Surgery

## 2013-09-29 ENCOUNTER — Other Ambulatory Visit (HOSPITAL_COMMUNITY): Payer: Self-pay | Admitting: *Deleted

## 2013-09-29 ENCOUNTER — Encounter (HOSPITAL_COMMUNITY): Payer: Self-pay

## 2013-09-29 DIAGNOSIS — Z01812 Encounter for preprocedural laboratory examination: Secondary | ICD-10-CM | POA: Insufficient documentation

## 2013-09-29 DIAGNOSIS — I498 Other specified cardiac arrhythmias: Secondary | ICD-10-CM | POA: Insufficient documentation

## 2013-09-29 DIAGNOSIS — Z01818 Encounter for other preprocedural examination: Secondary | ICD-10-CM | POA: Insufficient documentation

## 2013-09-29 DIAGNOSIS — I10 Essential (primary) hypertension: Secondary | ICD-10-CM | POA: Insufficient documentation

## 2013-09-29 DIAGNOSIS — Z01810 Encounter for preprocedural cardiovascular examination: Secondary | ICD-10-CM | POA: Insufficient documentation

## 2013-09-29 HISTORY — DX: Anxiety disorder, unspecified: F41.9

## 2013-09-29 LAB — CBC WITH DIFFERENTIAL/PLATELET
BASOS ABS: 0.1 10*3/uL (ref 0.0–0.1)
Basophils Relative: 1 % (ref 0–1)
Eosinophils Absolute: 0.1 10*3/uL (ref 0.0–0.7)
Eosinophils Relative: 3 % (ref 0–5)
HCT: 36.6 % — ABNORMAL LOW (ref 39.0–52.0)
HEMOGLOBIN: 12.6 g/dL — AB (ref 13.0–17.0)
LYMPHS PCT: 34 % (ref 12–46)
Lymphs Abs: 1.5 10*3/uL (ref 0.7–4.0)
MCH: 33.3 pg (ref 26.0–34.0)
MCHC: 34.4 g/dL (ref 30.0–36.0)
MCV: 96.8 fL (ref 78.0–100.0)
Monocytes Absolute: 0.3 10*3/uL (ref 0.1–1.0)
Monocytes Relative: 7 % (ref 3–12)
NEUTROS ABS: 2.5 10*3/uL (ref 1.7–7.7)
Neutrophils Relative %: 55 % (ref 43–77)
Platelets: 221 10*3/uL (ref 150–400)
RBC: 3.78 MIL/uL — ABNORMAL LOW (ref 4.22–5.81)
RDW: 12.8 % (ref 11.5–15.5)
WBC: 4.5 10*3/uL (ref 4.0–10.5)

## 2013-09-29 LAB — SURGICAL PCR SCREEN
MRSA, PCR: NEGATIVE
STAPHYLOCOCCUS AUREUS: NEGATIVE

## 2013-09-29 LAB — COMPREHENSIVE METABOLIC PANEL
ALT: 29 U/L (ref 0–53)
ANION GAP: 12 (ref 5–15)
AST: 29 U/L (ref 0–37)
Albumin: 3.7 g/dL (ref 3.5–5.2)
Alkaline Phosphatase: 43 U/L (ref 39–117)
BUN: 17 mg/dL (ref 6–23)
CALCIUM: 9 mg/dL (ref 8.4–10.5)
CO2: 26 meq/L (ref 19–32)
CREATININE: 1.16 mg/dL (ref 0.50–1.35)
Chloride: 100 mEq/L (ref 96–112)
GFR calc Af Amer: 77 mL/min — ABNORMAL LOW (ref >90)
GFR, EST NON AFRICAN AMERICAN: 67 mL/min — AB (ref >90)
Glucose, Bld: 89 mg/dL (ref 70–99)
Potassium: 4.9 mEq/L (ref 3.7–5.3)
Sodium: 138 mEq/L (ref 137–147)
Total Bilirubin: 0.4 mg/dL (ref 0.3–1.2)
Total Protein: 6.5 g/dL (ref 6.0–8.3)

## 2013-09-29 LAB — PROTIME-INR
INR: 1.02 (ref 0.00–1.49)
Prothrombin Time: 13.4 seconds (ref 11.6–15.2)

## 2013-09-29 LAB — TYPE AND SCREEN
ABO/RH(D): A POS
Antibody Screen: NEGATIVE

## 2013-09-29 NOTE — Progress Notes (Signed)
Pt's PCP is Dr. Justin Mend at [facility] [place]. Pt denies cardiac history, chest pain or sob.

## 2013-09-29 NOTE — Pre-Procedure Instructions (Signed)
KAIROS PANETTA  09/29/2013   Your procedure is scheduled on:  Friday, October 10, 2013 at 9:30 AM.   Report to [facility] Entrance "A" Admitting Office at 7:30 AM.   Call this number if you have problems the morning of surgery: [phone]   Remember:   Do not eat food or drink liquids after midnight Thursday, 10/09/13.   Take these medicines the morning of surgery with A SIP OF WATER: amLODipine (NORVASC), atenolol (TENORMIN), buPROPion (WELLBUTRIN), oxyCODONE-acetaminophen (PERCOCET)  - if needed, omeprazole (PRILOSEC OTC) - if needed.  Stop Vitamins and NSAIDS (Diclofenac, Ibuprofen, Aleve, etc) as of Friday, 10/03/13.   Do not wear jewelry.  Do not wear lotions, powders, or cologne. You may wear deodorant.  Men may shave face and neck.  Do not bring valuables to the hospital.  [facility] is not responsible                  for any belongings or valuables.               Contacts, dentures or bridgework may not be worn into surgery.  Leave suitcase in the car. After surgery it may be brought to your room.  For patients admitted to the hospital, discharge time is determined by your                treatment team.             Special Instructions: [facility] - Preparing for Surgery  Before surgery, you can play an important role.  Because skin is not sterile, your skin needs to be as free of germs as possible.  You can reduce the number of germs on you skin by washing with CHG (chlorahexidine gluconate) soap before surgery.  CHG is an antiseptic cleaner which kills germs and bonds with the skin to continue killing germs even after washing.  Please DO NOT use if you have an allergy to CHG or antibacterial soaps.  If your skin becomes reddened/irritated stop using the CHG and inform your nurse when you arrive at Short Stay.  Do not shave (including legs and underarms) for at least 48 hours prior to the first CHG shower.  You may shave your face.  Please follow these  instructions carefully:   1.  Shower with CHG Soap the night before surgery and the                                morning of Surgery.  2.  If you choose to wash your hair, wash your hair first as usual with your       normal shampoo.  3.  After you shampoo, rinse your hair and body thoroughly to remove the                      Shampoo.  4.  Use CHG as you would any other liquid soap.  You can apply chg directly       to the skin and wash gently with scrungie or a clean washcloth.  5.  Apply the CHG Soap to your body ONLY FROM THE NECK DOWN.        Do not use on open wounds or open sores.  Avoid contact with your eyes, ears, mouth and genitals (private parts).  Wash genitals (private parts) with your normal soap.  6.  Wash thoroughly, paying special attention  to the area where your surgery        will be performed.  7.  Thoroughly rinse your body with warm water from the neck down.  8.  DO NOT shower/wash with your normal soap after using and rinsing off       the CHG Soap.  9.  Pat yourself dry with a clean towel.            10.  Wear clean pajamas.            11.  Place clean sheets on your bed the night of your first shower and do not        sleep with pets.  Day of Surgery  Do not apply any lotions the morning of surgery.  Please wear clean clothes to the hospital/surgery center.     Please read over the following fact sheets that you were given: Pain Booklet, Coughing and Deep Breathing, Blood Transfusion Information, MRSA Information and Surgical Site Infection Prevention

## 2013-09-30 ENCOUNTER — Encounter (HOSPITAL_COMMUNITY): Payer: Self-pay

## 2013-09-30 NOTE — Progress Notes (Signed)
Anesthesia Chart Review:  Patient is a 58 year old male scheduled for L1-2, L2-3, L3-4 MAS, PLIF on 10/10/13 by Dr. Ronnald Ramp.  History includes non-smoker, HTN, toxoplasmosis, Hepatitis A, GERD, anxiety, left TKA, left IHR, cervical fusion, L4-5 fusion 01/2012. He reported drinking 4-5 glasses of wine per night. PCP is Dr. Maurice Small with [facility], last visit 09/22/13 with notes indicating that she is aware of upcoming surgery.  Preoperative CXR and labs noted.   EKG on 09/29/13 showed SB at 50 bpm, short PR, anteroseptal infarct (age undetermined), baseline wander artifact.  Interpreting cardiologist suggested repeating ECG. (I measured a normal PR interval at 140 ms.) Will plan to repeat his EKG on arrival to ensure a adequate baseline preoperative EKG.  [facility] Short Stay Center/Anesthesiology Phone [phone] 09/30/2013 3:33 PM

## 2013-10-09 MED ORDER — CEFAZOLIN SODIUM-DEXTROSE 2-3 GM-% IV SOLR
2.0000 g | INTRAVENOUS | Status: AC
  Administered 2013-10-10 (×2): 2 g via INTRAVENOUS
  Filled 2013-10-09: qty 50

## 2013-10-10 ENCOUNTER — Inpatient Hospital Stay (HOSPITAL_COMMUNITY): Payer: BC Managed Care – PPO

## 2013-10-10 ENCOUNTER — Encounter (HOSPITAL_COMMUNITY): Payer: BC Managed Care – PPO | Admitting: Vascular Surgery

## 2013-10-10 ENCOUNTER — Encounter (HOSPITAL_COMMUNITY)
Admission: RE | Disposition: A | Discharge status: Home / Self Care | Payer: BC Managed Care – PPO | Source: Ambulatory Visit | Attending: Neurological Surgery

## 2013-10-10 ENCOUNTER — Inpatient Hospital Stay (HOSPITAL_COMMUNITY): Payer: BC Managed Care – PPO | Admitting: Anesthesiology

## 2013-10-10 ENCOUNTER — Inpatient Hospital Stay (HOSPITAL_COMMUNITY)
Admission: RE | Admit: 2013-10-10 | Discharge: 2013-10-16 | DRG: 457 | Disposition: A | Discharge status: Home / Self Care | Payer: BC Managed Care – PPO | Source: Ambulatory Visit | Attending: Neurological Surgery | Admitting: Neurological Surgery

## 2013-10-10 ENCOUNTER — Encounter (HOSPITAL_COMMUNITY): Payer: Self-pay | Admitting: *Deleted

## 2013-10-10 DIAGNOSIS — I10 Essential (primary) hypertension: Secondary | ICD-10-CM | POA: Diagnosis present

## 2013-10-10 DIAGNOSIS — G9741 Accidental puncture or laceration of dura during a procedure: Secondary | ICD-10-CM | POA: Diagnosis not present

## 2013-10-10 DIAGNOSIS — M5137 Other intervertebral disc degeneration, lumbosacral region: Secondary | ICD-10-CM | POA: Diagnosis present

## 2013-10-10 DIAGNOSIS — F411 Generalized anxiety disorder: Secondary | ICD-10-CM | POA: Diagnosis present

## 2013-10-10 DIAGNOSIS — IMO0002 Reserved for concepts with insufficient information to code with codable children: Secondary | ICD-10-CM | POA: Diagnosis not present

## 2013-10-10 DIAGNOSIS — Z981 Arthrodesis status: Secondary | ICD-10-CM | POA: Diagnosis not present

## 2013-10-10 DIAGNOSIS — Z96659 Presence of unspecified artificial knee joint: Secondary | ICD-10-CM

## 2013-10-10 DIAGNOSIS — M479 Spondylosis, unspecified: Secondary | ICD-10-CM | POA: Diagnosis present

## 2013-10-10 DIAGNOSIS — M4180 Other forms of scoliosis, site unspecified: Principal | ICD-10-CM | POA: Diagnosis present

## 2013-10-10 DIAGNOSIS — M545 Low back pain, unspecified: Secondary | ICD-10-CM | POA: Diagnosis present

## 2013-10-10 DIAGNOSIS — I951 Orthostatic hypotension: Secondary | ICD-10-CM | POA: Diagnosis not present

## 2013-10-10 DIAGNOSIS — K219 Gastro-esophageal reflux disease without esophagitis: Secondary | ICD-10-CM | POA: Diagnosis present

## 2013-10-10 DIAGNOSIS — Z8249 Family history of ischemic heart disease and other diseases of the circulatory system: Secondary | ICD-10-CM | POA: Diagnosis not present

## 2013-10-10 DIAGNOSIS — M51379 Other intervertebral disc degeneration, lumbosacral region without mention of lumbar back pain or lower extremity pain: Secondary | ICD-10-CM | POA: Diagnosis present

## 2013-10-10 HISTORY — PX: MAXIMUM ACCESS (MAS)POSTERIOR LUMBAR INTERBODY FUSION (PLIF) 3 LEVEL: SHX6370

## 2013-10-10 SURGERY — FOR MAXIMUM ACCESS (MAS) POSTERIOR LUMBAR INTERBODY FUSION (PLIF) 3 LEVEL
Anesthesia: General | Site: Spine Lumbar

## 2013-10-10 MED ORDER — PROPOFOL 10 MG/ML IV BOLUS
INTRAVENOUS | Status: DC | PRN
  Administered 2013-10-10: 50 mg via INTRAVENOUS
  Administered 2013-10-10: 40 mg via INTRAVENOUS
  Administered 2013-10-10: 50 mg via INTRAVENOUS
  Administered 2013-10-10: 100 mg via INTRAVENOUS
  Administered 2013-10-10: 160 mg via INTRAVENOUS

## 2013-10-10 MED ORDER — FENTANYL CITRATE 0.05 MG/ML IJ SOLN
INTRAMUSCULAR | Status: AC
  Filled 2013-10-10: qty 5

## 2013-10-10 MED ORDER — MIDAZOLAM HCL 5 MG/5ML IJ SOLN
INTRAMUSCULAR | Status: DC | PRN
  Administered 2013-10-10: 2 mg via INTRAVENOUS

## 2013-10-10 MED ORDER — THROMBIN 20000 UNITS EX SOLR
CUTANEOUS | Status: DC | PRN
  Administered 2013-10-10: 11:00:00 via TOPICAL

## 2013-10-10 MED ORDER — MIDAZOLAM HCL 2 MG/2ML IJ SOLN
INTRAMUSCULAR | Status: AC
  Filled 2013-10-10: qty 2

## 2013-10-10 MED ORDER — LACTATED RINGERS IV SOLN
INTRAVENOUS | Status: DC | PRN
  Administered 2013-10-10 (×3): via INTRAVENOUS

## 2013-10-10 MED ORDER — ONDANSETRON HCL 4 MG/2ML IJ SOLN: 4.0000 mg | INTRAMUSCULAR | Status: DC | PRN

## 2013-10-10 MED ORDER — DEXAMETHASONE SODIUM PHOSPHATE 4 MG/ML IJ SOLN
INTRAMUSCULAR | Status: AC
  Filled 2013-10-10: qty 1

## 2013-10-10 MED ORDER — AMLODIPINE BESYLATE 5 MG PO TABS
5.0000 mg | ORAL_TABLET | Freq: Every day | ORAL | Status: DC
  Administered 2013-10-10 – 2013-10-11 (×2): 5 mg via ORAL
  Filled 2013-10-10 (×4): qty 1

## 2013-10-10 MED ORDER — ACETAMINOPHEN 650 MG RE SUPP: 650.0000 mg | RECTAL | Status: DC | PRN

## 2013-10-10 MED ORDER — ARTIFICIAL TEARS OP OINT
TOPICAL_OINTMENT | OPHTHALMIC | Status: DC | PRN
  Administered 2013-10-10: 1 via OPHTHALMIC

## 2013-10-10 MED ORDER — SENNA 8.6 MG PO TABS
1.0000 | ORAL_TABLET | Freq: Two times a day (BID) | ORAL | Status: DC
  Administered 2013-10-10 – 2013-10-13 (×7): 8.6 mg via ORAL
  Filled 2013-10-10 (×10): qty 1

## 2013-10-10 MED ORDER — BACITRACIN 50000 UNITS IM SOLR
INTRAMUSCULAR | Status: DC | PRN
  Administered 2013-10-10: 11:00:00

## 2013-10-10 MED ORDER — ARTIFICIAL TEARS OP OINT
TOPICAL_OINTMENT | OPHTHALMIC | Status: AC
  Filled 2013-10-10: qty 3.5

## 2013-10-10 MED ORDER — DEXTROSE 5 % IV SOLN
INTRAVENOUS | Status: DC | PRN
  Administered 2013-10-10: 10:00:00 via INTRAVENOUS

## 2013-10-10 MED ORDER — ONDANSETRON HCL 4 MG/2ML IJ SOLN
INTRAMUSCULAR | Status: AC
  Filled 2013-10-10: qty 2

## 2013-10-10 MED ORDER — SODIUM CHLORIDE 0.9 % IJ SOLN: 3.0000 mL | INTRAMUSCULAR | Status: DC | PRN

## 2013-10-10 MED ORDER — OXYCODONE HCL 5 MG PO TABS
ORAL_TABLET | ORAL | Status: AC
  Filled 2013-10-10: qty 1

## 2013-10-10 MED ORDER — BUPROPION HCL 100 MG PO TABS
100.0000 mg | ORAL_TABLET | Freq: Two times a day (BID) | ORAL | Status: DC
  Administered 2013-10-10 – 2013-10-16 (×12): 100 mg via ORAL
  Filled 2013-10-10 (×14): qty 1

## 2013-10-10 MED ORDER — ONDANSETRON HCL 4 MG/2ML IJ SOLN: 4.0000 mg | Freq: Four times a day (QID) | INTRAMUSCULAR | Status: DC | PRN

## 2013-10-10 MED ORDER — FENTANYL CITRATE 0.05 MG/ML IJ SOLN
INTRAMUSCULAR | Status: DC | PRN
  Administered 2013-10-10: 50 ug via INTRAVENOUS
  Administered 2013-10-10: 100 ug via INTRAVENOUS
  Administered 2013-10-10: 50 ug via INTRAVENOUS
  Administered 2013-10-10 (×2): 100 ug via INTRAVENOUS
  Administered 2013-10-10 (×2): 50 ug via INTRAVENOUS
  Administered 2013-10-10: 100 ug via INTRAVENOUS
  Administered 2013-10-10: 50 ug via INTRAVENOUS
  Administered 2013-10-10: 100 ug via INTRAVENOUS

## 2013-10-10 MED ORDER — ONDANSETRON HCL 4 MG/2ML IJ SOLN
INTRAMUSCULAR | Status: DC | PRN
  Administered 2013-10-10 (×2): 4 mg via INTRAVENOUS

## 2013-10-10 MED ORDER — TERBINAFINE HCL 250 MG PO TABS
250.0000 mg | ORAL_TABLET | Freq: Every day | ORAL | Status: DC
  Administered 2013-10-10 – 2013-10-16 (×7): 250 mg via ORAL
  Filled 2013-10-10 (×9): qty 1

## 2013-10-10 MED ORDER — DIAZEPAM 5 MG/ML IJ SOLN
2.5000 mg | Freq: Once | INTRAMUSCULAR | Status: AC
  Administered 2013-10-10: 2.5 mg via INTRAVENOUS

## 2013-10-10 MED ORDER — DEXAMETHASONE SODIUM PHOSPHATE 10 MG/ML IJ SOLN
10.0000 mg | INTRAMUSCULAR | Status: DC
  Filled 2013-10-10: qty 1

## 2013-10-10 MED ORDER — METHOCARBAMOL 1000 MG/10ML IJ SOLN
500.0000 mg | Freq: Four times a day (QID) | INTRAVENOUS | Status: DC | PRN
  Administered 2013-10-10: 500 mg via INTRAVENOUS
  Filled 2013-10-10: qty 5

## 2013-10-10 MED ORDER — PROPOFOL 10 MG/ML IV BOLUS
INTRAVENOUS | Status: AC
  Filled 2013-10-10: qty 20

## 2013-10-10 MED ORDER — LIDOCAINE HCL (CARDIAC) 20 MG/ML IV SOLN
INTRAVENOUS | Status: DC | PRN
Start: 2013-10-10 — End: 2013-10-10
  Administered 2013-10-10: 50 mg via INTRAVENOUS
  Administered 2013-10-10: 60 mg via INTRAVENOUS
  Administered 2013-10-10: 40 mg via INTRAVENOUS

## 2013-10-10 MED ORDER — SUCCINYLCHOLINE CHLORIDE 20 MG/ML IJ SOLN
INTRAMUSCULAR | Status: DC | PRN
  Administered 2013-10-10: 120 mg via INTRAVENOUS

## 2013-10-10 MED ORDER — OXYCODONE HCL 5 MG/5ML PO SOLN: 5.0000 mg | Freq: Once | ORAL | Status: AC | PRN

## 2013-10-10 MED ORDER — OXYCODONE HCL 5 MG PO TABS
5.0000 mg | ORAL_TABLET | Freq: Once | ORAL | Status: AC | PRN
  Administered 2013-10-10: 5 mg via ORAL

## 2013-10-10 MED ORDER — SODIUM CHLORIDE 0.9 % IJ SOLN
3.0000 mL | Freq: Two times a day (BID) | INTRAMUSCULAR | Status: DC
  Administered 2013-10-10: 3 mL via INTRAVENOUS

## 2013-10-10 MED ORDER — DEXAMETHASONE SODIUM PHOSPHATE 4 MG/ML IJ SOLN
4.0000 mg | Freq: Four times a day (QID) | INTRAMUSCULAR | Status: DC
  Administered 2013-10-10: 4 mg via INTRAVENOUS
  Filled 2013-10-10 (×2): qty 1

## 2013-10-10 MED ORDER — METHOCARBAMOL 500 MG PO TABS
500.0000 mg | ORAL_TABLET | Freq: Four times a day (QID) | ORAL | Status: DC | PRN
  Administered 2013-10-11 – 2013-10-15 (×12): 500 mg via ORAL
  Filled 2013-10-10 (×14): qty 1

## 2013-10-10 MED ORDER — ATENOLOL 25 MG PO TABS
50.0000 mg | ORAL_TABLET | Freq: Every day | ORAL | Status: DC
  Administered 2013-10-10: 50 mg via ORAL
  Filled 2013-10-10 (×4): qty 2

## 2013-10-10 MED ORDER — ZOLPIDEM TARTRATE 5 MG PO TABS
5.0000 mg | ORAL_TABLET | Freq: Every evening | ORAL | Status: DC | PRN
  Administered 2013-10-11 – 2013-10-15 (×5): 5 mg via ORAL
  Filled 2013-10-10 (×5): qty 1

## 2013-10-10 MED ORDER — LIDOCAINE HCL (CARDIAC) 20 MG/ML IV SOLN
INTRAVENOUS | Status: AC
  Filled 2013-10-10: qty 5

## 2013-10-10 MED ORDER — ACETAMINOPHEN 325 MG PO TABS
650.0000 mg | ORAL_TABLET | ORAL | Status: DC | PRN
  Administered 2013-10-14 – 2013-10-16 (×9): 650 mg via ORAL
  Filled 2013-10-10 (×8): qty 2

## 2013-10-10 MED ORDER — PANTOPRAZOLE SODIUM 40 MG IV SOLR
40.0000 mg | Freq: Every day | INTRAVENOUS | Status: DC
  Administered 2013-10-10: 40 mg via INTRAVENOUS
  Filled 2013-10-10: qty 40

## 2013-10-10 MED ORDER — DEXAMETHASONE SODIUM PHOSPHATE 10 MG/ML IJ SOLN
INTRAMUSCULAR | Status: AC
  Filled 2013-10-10: qty 1

## 2013-10-10 MED ORDER — MORPHINE SULFATE 2 MG/ML IJ SOLN
1.0000 mg | INTRAMUSCULAR | Status: DC | PRN
  Administered 2013-10-10 – 2013-10-12 (×4): 4 mg via INTRAVENOUS
  Administered 2013-10-12 (×2): 2 mg via INTRAVENOUS
  Administered 2013-10-12 (×2): 4 mg via INTRAVENOUS
  Administered 2013-10-12 – 2013-10-13 (×2): 2 mg via INTRAVENOUS
  Administered 2013-10-13 (×3): 4 mg via INTRAVENOUS
  Filled 2013-10-10 (×2): qty 2
  Filled 2013-10-10 (×3): qty 1
  Filled 2013-10-10 (×3): qty 2
  Filled 2013-10-10: qty 1
  Filled 2013-10-10 (×5): qty 2

## 2013-10-10 MED ORDER — SODIUM CHLORIDE 0.9 % IV SOLN
10.0000 mg | INTRAVENOUS | Status: DC | PRN
  Administered 2013-10-10: 10 ug/min via INTRAVENOUS

## 2013-10-10 MED ORDER — POTASSIUM CHLORIDE IN NACL 20-0.9 MEQ/L-% IV SOLN
INTRAVENOUS | Status: DC
  Administered 2013-10-10: 21:00:00 via INTRAVENOUS
  Administered 2013-10-13: 75 mL/h via INTRAVENOUS
  Filled 2013-10-10 (×4): qty 1000

## 2013-10-10 MED ORDER — 0.9 % SODIUM CHLORIDE (POUR BTL) OPTIME
TOPICAL | Status: DC | PRN
  Administered 2013-10-10: 1000 mL

## 2013-10-10 MED ORDER — SODIUM CHLORIDE 0.9 % IV SOLN: 250.0000 mL | INTRAVENOUS | Status: DC

## 2013-10-10 MED ORDER — ALBUMIN HUMAN 5 % IV SOLN
INTRAVENOUS | Status: DC | PRN
  Administered 2013-10-10 (×2): via INTRAVENOUS

## 2013-10-10 MED ORDER — DIAZEPAM 5 MG/ML IJ SOLN
INTRAMUSCULAR | Status: AC
  Filled 2013-10-10: qty 2

## 2013-10-10 MED ORDER — DEXAMETHASONE 4 MG PO TABS
4.0000 mg | ORAL_TABLET | Freq: Four times a day (QID) | ORAL | Status: DC
  Administered 2013-10-11 – 2013-10-13 (×10): 4 mg via ORAL
  Filled 2013-10-10 (×10): qty 1

## 2013-10-10 MED ORDER — MENTHOL 3 MG MT LOZG: 1.0000 | LOZENGE | OROMUCOSAL | Status: DC | PRN

## 2013-10-10 MED ORDER — PROPOFOL INFUSION 10 MG/ML OPTIME
INTRAVENOUS | Status: DC | PRN
  Administered 2013-10-10: 100 ug/kg/min via INTRAVENOUS

## 2013-10-10 MED ORDER — CEFAZOLIN SODIUM 1-5 GM-% IV SOLN
1.0000 g | Freq: Three times a day (TID) | INTRAVENOUS | Status: AC
  Administered 2013-10-10 – 2013-10-11 (×2): 1 g via INTRAVENOUS
  Filled 2013-10-10 (×2): qty 50

## 2013-10-10 MED ORDER — PHENOL 1.4 % MT LIQD: 1.0000 | OROMUCOSAL | Status: DC | PRN

## 2013-10-10 MED ORDER — HYDROMORPHONE HCL PF 1 MG/ML IJ SOLN
0.2500 mg | INTRAMUSCULAR | Status: DC | PRN
  Administered 2013-10-10 (×4): 0.5 mg via INTRAVENOUS

## 2013-10-10 MED ORDER — BUPIVACAINE HCL (PF) 0.25 % IJ SOLN
INTRAMUSCULAR | Status: DC | PRN
  Administered 2013-10-10: 5 mL

## 2013-10-10 MED ORDER — THROMBIN 5000 UNITS EX SOLR
OROMUCOSAL | Status: DC | PRN
  Administered 2013-10-10 (×2): via TOPICAL

## 2013-10-10 MED ORDER — CEFAZOLIN SODIUM-DEXTROSE 2-3 GM-% IV SOLR
INTRAVENOUS | Status: AC
  Filled 2013-10-10: qty 50

## 2013-10-10 MED ORDER — SODIUM CHLORIDE 0.9 % IV SOLN
INTRAVENOUS | Status: DC | PRN
  Administered 2013-10-10: 15:00:00 via INTRAVENOUS

## 2013-10-10 MED ORDER — GLYCOPYRROLATE 0.2 MG/ML IJ SOLN
INTRAMUSCULAR | Status: DC | PRN
  Administered 2013-10-10: 0.4 mg via INTRAVENOUS

## 2013-10-10 MED ORDER — EPHEDRINE SULFATE 50 MG/ML IJ SOLN
INTRAMUSCULAR | Status: DC | PRN
  Administered 2013-10-10: 10 mg via INTRAVENOUS

## 2013-10-10 MED ORDER — OXYCODONE HCL 5 MG PO TABS
10.0000 mg | ORAL_TABLET | ORAL | Status: DC | PRN
  Administered 2013-10-11 – 2013-10-16 (×27): 10 mg via ORAL
  Filled 2013-10-10 (×28): qty 2

## 2013-10-10 MED ORDER — HYDROMORPHONE HCL PF 1 MG/ML IJ SOLN
INTRAMUSCULAR | Status: AC
  Administered 2013-10-10: 0.5 mg via INTRAVENOUS
  Filled 2013-10-10: qty 2

## 2013-10-10 MED ORDER — CELECOXIB 200 MG PO CAPS
200.0000 mg | ORAL_CAPSULE | Freq: Two times a day (BID) | ORAL | Status: AC
  Administered 2013-10-10 – 2013-10-15 (×10): 200 mg via ORAL
  Filled 2013-10-10 (×10): qty 1

## 2013-10-10 MED ORDER — DEXAMETHASONE SODIUM PHOSPHATE 10 MG/ML IJ SOLN
INTRAMUSCULAR | Status: DC | PRN
  Administered 2013-10-10: 10 mg via INTRAVENOUS
  Administered 2013-10-10: 4 mg via INTRAVENOUS

## 2013-10-10 SURGICAL SUPPLY — 70 items
BAG DECANTER FOR FLEXI CONT (MISCELLANEOUS) ×2 IMPLANT
BENZOIN TINCTURE PRP APPL 2/3 (GAUZE/BANDAGES/DRESSINGS) ×2 IMPLANT
BLADE SURG ROTATE 9660 (MISCELLANEOUS) ×2 IMPLANT
BONE MATRIX OSTEOCEL PRO MED (Bone Implant) ×2 IMPLANT
BONE MATRIX OSTEOCEL PRO SM (Bone Implant) ×2 IMPLANT
BUR MATCHSTICK NEURO 3.0 LAGG (BURR) ×2 IMPLANT
CAGE COROENT MP 8X23 (Cage) ×12 IMPLANT
CANISTER SUCT 3000ML (MISCELLANEOUS) ×2 IMPLANT
CLIP NEUROVISION LG (CLIP) ×2 IMPLANT
CONT SPEC 4OZ CLIKSEAL STRL BL (MISCELLANEOUS) ×4 IMPLANT
COVER BACK TABLE 24X17X13 BIG (DRAPES) IMPLANT
COVER TABLE BACK 60X90 (DRAPES) ×2 IMPLANT
DRAPE C-ARM 42X72 X-RAY (DRAPES) ×2 IMPLANT
DRAPE C-ARMOR (DRAPES) ×2 IMPLANT
DRAPE LAPAROTOMY 100X72X124 (DRAPES) ×2 IMPLANT
DRAPE POUCH INSTRU U-SHP 10X18 (DRAPES) ×2 IMPLANT
DRAPE SURG 17X23 STRL (DRAPES) ×2 IMPLANT
DRSG OPSITE 4X5.5 SM (GAUZE/BANDAGES/DRESSINGS) IMPLANT
DRSG OPSITE POSTOP 4X8 (GAUZE/BANDAGES/DRESSINGS) ×2 IMPLANT
DRSG TELFA 3X8 NADH (GAUZE/BANDAGES/DRESSINGS) IMPLANT
DURAPREP 26ML APPLICATOR (WOUND CARE) ×2 IMPLANT
ELECT REM PT RETURN 9FT ADLT (ELECTROSURGICAL) ×2
ELECTRODE REM PT RTRN 9FT ADLT (ELECTROSURGICAL) ×1 IMPLANT
EVACUATOR 1/8 PVC DRAIN (DRAIN) ×2 IMPLANT
GAUZE SPONGE 4X4 16PLY XRAY LF (GAUZE/BANDAGES/DRESSINGS) IMPLANT
GLOVE BIO SURGEON STRL SZ 6.5 (GLOVE) ×2 IMPLANT
GLOVE BIO SURGEON STRL SZ8 (GLOVE) ×4 IMPLANT
GLOVE BIOGEL PI IND STRL 7.0 (GLOVE) ×4 IMPLANT
GLOVE BIOGEL PI IND STRL 8.5 (GLOVE) ×1 IMPLANT
GLOVE BIOGEL PI INDICATOR 7.0 (GLOVE) ×4
GLOVE BIOGEL PI INDICATOR 8.5 (GLOVE) ×1
GLOVE SURG SS PI 7.0 STRL IVOR (GLOVE) ×10 IMPLANT
GOWN STRL REUS W/ TWL LRG LVL3 (GOWN DISPOSABLE) ×2 IMPLANT
GOWN STRL REUS W/ TWL XL LVL3 (GOWN DISPOSABLE) ×4 IMPLANT
GOWN STRL REUS W/TWL 2XL LVL3 (GOWN DISPOSABLE) IMPLANT
GOWN STRL REUS W/TWL LRG LVL3 (GOWN DISPOSABLE) ×2
GOWN STRL REUS W/TWL XL LVL3 (GOWN DISPOSABLE) ×4
HEMOSTAT POWDER KIT SURGIFOAM (HEMOSTASIS) ×2 IMPLANT
KIT BASIN OR (CUSTOM PROCEDURE TRAY) ×2 IMPLANT
KIT NEEDLE NVM5 EMG ELECT (KITS) ×1 IMPLANT
KIT NEEDLE NVM5 EMG ELECTRODE (KITS) ×1
KIT ROOM TURNOVER OR (KITS) ×2 IMPLANT
MILL MEDIUM DISP (BLADE) ×2 IMPLANT
NEEDLE HYPO 25X1 1.5 SAFETY (NEEDLE) ×2 IMPLANT
NS IRRIG 1000ML POUR BTL (IV SOLUTION) ×2 IMPLANT
PACK LAMINECTOMY NEURO (CUSTOM PROCEDURE TRAY) ×2 IMPLANT
PAD ARMBOARD 7.5X6 YLW CONV (MISCELLANEOUS) ×10 IMPLANT
PATTIES SURGICAL 1X1 (DISPOSABLE) ×2 IMPLANT
ROD FIXATION MAS PREBENT1 110M (Rod) ×4 IMPLANT
SCREW LOCK (Screw) ×8 IMPLANT
SCREW LOCK FXNS SPNE MAS PL (Screw) ×8 IMPLANT
SCREW MAS PLIF 5.5X30 (Screw) ×4 IMPLANT
SCREW PAS PLIF 5X30 (Screw) ×4 IMPLANT
SCREW SHANK 5.0X30MM (Screw) ×4 IMPLANT
SCREW TULIP 5.5 (Screw) ×4 IMPLANT
SPONGE LAP 4X18 X RAY DECT (DISPOSABLE) ×2 IMPLANT
SPONGE SURGIFOAM ABS GEL 100 (HEMOSTASIS) ×2 IMPLANT
STRIP CLOSURE SKIN 1/2X4 (GAUZE/BANDAGES/DRESSINGS) ×6 IMPLANT
SUT PROLENE 5 0 C1 (SUTURE) ×2 IMPLANT
SUT VIC AB 0 CT1 18XCR BRD8 (SUTURE) ×3 IMPLANT
SUT VIC AB 0 CT1 8-18 (SUTURE) ×3
SUT VIC AB 2-0 CP2 18 (SUTURE) ×4 IMPLANT
SUT VIC AB 3-0 SH 8-18 (SUTURE) ×4 IMPLANT
SYR 20ML ECCENTRIC (SYRINGE) ×2 IMPLANT
SYR 3ML LL SCALE MARK (SYRINGE) IMPLANT
TOWEL OR 17X24 6PK STRL BLUE (TOWEL DISPOSABLE) ×2 IMPLANT
TOWEL OR 17X26 10 PK STRL BLUE (TOWEL DISPOSABLE) ×2 IMPLANT
TRAY FOLEY CATH 14FRSI W/METER (CATHETERS) IMPLANT
TRAY FOLEY CATH 16FRSI W/METER (SET/KITS/TRAYS/PACK) ×2 IMPLANT
WATER STERILE IRR 1000ML POUR (IV SOLUTION) ×2 IMPLANT

## 2013-10-10 NOTE — Op Note (Signed)
10/10/2013  4:22 PM  PATIENT:  Andrew Suarez  58 y.o. male  PRE-OPERATIVE DIAGNOSIS:  Degenerative scoliosis, degenerative disc disease L1-2, L2-3, spinal stenosis L1-2, L2-3, L3-4, back and leg pain  POST-OPERATIVE DIAGNOSIS:  Same  PROCEDURE:   1. Decompressive lumbar laminectomy L1-2, L2-3, L3-4 requiring more work than would be required for a simple exposure of the disk for PLIF in order to adequately decompress the neural elements and address the spinal stenosis 2. Posterior lumbar interbody fusion L1-2, L2-3, L3-4 using PEEK interbody cages packed with morcellized allograft and autograft 3. Posterior fixation L1-L4 inclusive using Nuvasive cortical pedicle screws.  4. removal of hardware L4-5  SURGEON:  Sherley Bounds, MD  ASSISTANTS: Dr. Saintclair Halsted  ANESTHESIA:  General  EBL: 800 ml  Total I/O In: 2951 [I.V.:3850; Blood:260; IV OACZYSAYT:016] Out: 2000 [Urine:1100; Blood:900]  BLOOD ADMINISTERED:300 CC CELLSAVER  DRAINS: Hemovac   INDICATION FOR PROCEDURE: This patient underwent a previous L4-5 posterior lumbar interbody fusion. He presented with chronic progressive low back pain and was found to have degenerative scoliosis with severe degenerative disease at L1-2 and L2-3 with stenosis L1-L4. He tried medical management quite some time without relief. Recommended a a posterior lumbar interbody fusion L1-L4 with segmental instrumentation and removal of hardware L5. Patient understood the risks, benefits, and alternatives and potential outcomes and wished to proceed.  PROCEDURE DETAILS:  The patient was brought to the operating room. After induction of generalized endotracheal anesthesia the patient was rolled into the prone position on chest rolls and all pressure points were padded. The patient's lumbar region was cleaned and then prepped with DuraPrep and draped in the usual sterile fashion. Anesthesia was injected and then a dorsal midline incision was made and carried down to  the lumbosacral fascia. The fascia was opened and the paraspinous musculature was taken down in a subperiosteal fashion to expose L1-L5. The old hardware was exposed in the locking caps were removed from the tulip heads and the rods were removed. The L5 screws were removed. The L4 screws had good purchase. A self-retaining retractor was placed. Intraoperative fluoroscopy confirmed my level, and I started with placement of the L1 cortical pedicle screws. The pedicle screw entry zones were identified utilizing surface landmarks and  AP and lateral fluoroscopy. I scored the cortex with the high-speed drill and then used the hand drill and EMG monitoring to drill an upward and outward direction into the pedicle. I then tapped line to line, and the tap was also monitored. I then placed a 5-0 x 30 mm cortical pedicle screw into the pedicles of L1 bilaterally. I then turned my attention to the decompression and the spinous process was removed and complete lumbar laminectomies, hemi- facetectomies, and foraminotomies were performed at L1-2, L2-3, L3-4. The patient had significant spinal stenosis and this required more work than would be required for a simple exposure of the disc for posterior lumbar interbody fusion. Much more generous decompression was undertaken in order to adequately decompress the neural elements and address the patient's leg pain. The yellow ligament was removed to expose the underlying dura and nerve roots, and generous foraminotomies were performed to adequately decompress the neural elements. Both the exiting and traversing nerve roots were decompressed on both sides until a coronary dilator passed easily along the nerve roots at each level. At the level of the L2 pedicle on the right a tiny unintended durotomy was created that was able to be closed with simple bipolar cautery. Once the decompression was  complete, I turned my attention to the posterior lower lumbar interbody fusion. The epidural  venous vasculature was coagulated and cut sharply. Disc space was incised at each level and the initial discectomy was performed with pituitary rongeurs. The disc space was distracted with sequential distractors to a height of 8 mm at each level. We then used a series of scrapers and shavers to prepare the endplates for fusion. The midline was prepared with Epstein curettes. Once the complete discectomy was finished, we packed an appropriate sized peek interbody cage with local autograft and morcellized allograft, gently retracted the nerve root, and tapped the cage into position at L1-2, L2-3, and L3-4.  The midline between the cages was packed with morselized autograft and allograft. We then turned our attention to the placement of the lower pedicle screws. The pedicle screw entry zones were identified utilizing surface landmarks and fluoroscopy. I drilled into each pedicle utilizing the hand drill and EMG monitoring, and tapped each pedicle with the appropriate tap. We palpated with a ball probe to assure no break in the cortex. We then placed 5-0 x 30 mm cortical pedicle screws into the pedicles bilaterally at L2 and L3.  We then placed lordotic rods into the multiaxial screw heads of the pedicle screws from L1-L4 bilaterally and locked these in position with the locking caps and anti-torque device. We then checked our construct with AP and lateral fluoroscopy. Irrigated with copious amounts of bacitracin-containing saline solution. Placed a medium Hemovac drain through separate stab incision. Inspected the nerve roots once again to assure adequate decompression, lined to the dura with Gelfoam, and closed the muscle and the fascia with 0 Vicryl. Closed the subcutaneous tissues with 2-0 Vicryl and subcuticular tissues with 3-0 Vicryl. The skin was closed with benzoin and Steri-Strips. Dressing was then applied, the patient was awakened from general anesthesia and transported to the recovery room in stable  condition. At the end of the procedure all sponge, needle and instrument counts were correct.   PLAN OF CARE: Admit to inpatient   PATIENT DISPOSITION:  PACU - hemodynamically stable.   Delay start of Pharmacological VTE agent (>24hrs) due to surgical blood loss or risk of bleeding:  yes

## 2013-10-10 NOTE — Anesthesia Postprocedure Evaluation (Signed)
  Anesthesia Post-op Note  Patient: Andrew Suarez  Procedure(s) Performed: Procedure(s): LUMBAR ONE-TWO,LUMBAR TWO-THREE,LUMBAR THREE-FOUR MAXIMUM ACCESS SURGERY POSTERIOR LUMBAR INTERBODY FUSION,REMOVAL OF HARDWARE LUMBAR FOUR-FIVE (N/A)  Patient Location: PACU  Anesthesia Type: General   Level of Consciousness: awake, alert  and oriented  Airway and Oxygen Therapy: Patient Spontanous Breathing  Post-op Pain: mild  Post-op Assessment: Post-op Vital signs reviewed  Post-op Vital Signs: Reviewed  Last Vitals:  Filed Vitals:   10/10/13 1741  BP: 106/56  Pulse: 64  Temp:   Resp: 20    Complications: No apparent anesthesia complications

## 2013-10-10 NOTE — Anesthesia Procedure Notes (Addendum)
Procedure Name: Intubation Date/Time: 10/10/2013 9:46 AM Performed by: Maude Leriche D Pre-anesthesia Checklist: Patient identified, Emergency Drugs available, Suction available, Patient being monitored and Timeout performed Patient Re-evaluated:Patient Re-evaluated prior to inductionOxygen Delivery Method: Circle system utilized Preoxygenation: Pre-oxygenation with 100% oxygen Intubation Type: IV induction and Cricoid Pressure applied Ventilation: Mask ventilation without difficulty Laryngoscope Size: Miller and 2 Grade View: Grade I Tube type: Oral Tube size: 8.0 mm Number of attempts: 1 Airway Equipment and Method: Stylet Placement Confirmation: ETT inserted through vocal cords under direct vision,  positive ETCO2 and breath sounds checked- equal and bilateral Secured at: 22 cm Tube secured with: Tape Dental Injury: Teeth and Oropharynx as per pre-operative assessment

## 2013-10-10 NOTE — Anesthesia Preprocedure Evaluation (Signed)
Anesthesia Evaluation  Patient identified by MRN, date of birth, ID band Patient awake    Reviewed: Allergy & Precautions, H&P , NPO status , Patient's Chart, lab work & pertinent test results  Airway Mallampati: II  Neck ROM: full    Dental   Pulmonary neg pulmonary ROS,          Cardiovascular hypertension,     Neuro/Psych Anxiety    GI/Hepatic GERD-  ,(+) Hepatitis -, A  Endo/Other    Renal/GU      Musculoskeletal   Abdominal   Peds  Hematology   Anesthesia Other Findings   Reproductive/Obstetrics                           Anesthesia Physical Anesthesia Plan  ASA: II  Anesthesia Plan: General   Post-op Pain Management:    Induction: Intravenous  Airway Management Planned: Oral ETT  Additional Equipment:   Intra-op Plan:   Post-operative Plan: Extubation in OR  Informed Consent: I have reviewed the patients History and Physical, chart, labs and discussed the procedure including the risks, benefits and alternatives for the proposed anesthesia with the patient or authorized representative who has indicated his/her understanding and acceptance.     Plan Discussed with: CRNA, Anesthesiologist and Surgeon  Anesthesia Plan Comments:         Anesthesia Quick Evaluation

## 2013-10-10 NOTE — OR Nursing (Signed)
Neuro Monitoring per Nuvasive. [facility]Day RN

## 2013-10-10 NOTE — Transfer of Care (Signed)
Immediate Anesthesia Transfer of Care Note  Patient: Andrew Suarez  Procedure(s) Performed: Procedure(s): LUMBAR ONE-TWO,LUMBAR TWO-THREE,LUMBAR THREE-FOUR MAXIMUM ACCESS SURGERY POSTERIOR LUMBAR INTERBODY FUSION,REMOVAL OF HARDWARE LUMBAR FOUR-FIVE (N/A)  Patient Location: PACU  Anesthesia Type:General  Level of Consciousness: awake, oriented, sedated, patient cooperative and responds to stimulation  Airway & Oxygen Therapy: Patient Spontanous Breathing and Patient connected to nasal cannula oxygen  Post-op Assessment: Report given to PACU RN, Post -op Vital signs reviewed and stable, Patient moving all extremities and Patient moving all extremities X 4  Post vital signs: Reviewed and stable  Complications: No apparent anesthesia complications

## 2013-10-10 NOTE — Progress Notes (Signed)
Pt admitted to 4N11 from PACU.  Alert and oriented. Spouse at bedside.  Lips noted to be somewhat swollen and a small area on rt side of mouth.  PACU RN stated from positioning from long surgery. Pt has no complaints at present.  Bed alarm set, call bell within reach.  Will continue to monitor.

## 2013-10-10 NOTE — H&P (Signed)
Subjective: Patient is a 58 y.o. male admitted for PLIF. Onset of symptoms was several years ago, gradually worsening since that time.  The pain is rated severe, and is located at the across the lower back and radiates to LLE. The pain is described as aching and occurs all day. The symptoms have been progressive. Symptoms are exacerbated by exercise and standing. MRI or CT showed multilevel spondylosis/ DDD/ scoliosis.   Past Medical History  Diagnosis Date  . Toxoplasmosis     left neck  . Hypertension     does not see a cardiolgist  . Hepatitis A   . GERD (gastroesophageal reflux disease)   . Anxiety     Past Surgical History  Procedure Laterality Date  . Total knee arthroplasty      left  . Inguinal hernia repair      left  . Rotator cuff repair      left   . Arthoscopy      knee  . Elbow surgery      bone removal  . Neck fusion    . Back surgery      lumbar  . Colonoscopy      pre-cancerous polyps  . Vasectomy      Prior to Admission medications   Medication Sig Start Date End Date Taking? Authorizing Provider  amLODipine (NORVASC) 5 MG tablet Take 5 mg by mouth daily.   Yes Historical Provider, MD  atenolol (TENORMIN) 50 MG tablet Take 50 mg by mouth daily.   Yes Historical Provider, MD  buPROPion (WELLBUTRIN) 100 MG tablet Take 100 mg by mouth 2 (two) times daily.   Yes Historical Provider, MD  diclofenac (VOLTAREN) 75 MG EC tablet Take 75 mg by mouth 2 (two) times daily.   Yes Historical Provider, MD  Multiple Vitamin (MULTIVITAMIN WITH MINERALS) TABS Take 1 tablet by mouth daily.   Yes Historical Provider, MD  omeprazole (PRILOSEC OTC) 20 MG tablet Take 20 mg by mouth daily as needed. For heartburn   Yes Historical Provider, MD  oxyCODONE-acetaminophen (PERCOCET) 10-325 MG per tablet Take 1 tablet by mouth every 4 (four) hours as needed for pain.   Yes Historical Provider, MD  terbinafine (LAMISIL) 250 MG tablet Take 250 mg by mouth daily.   Yes Historical Provider,  MD   No Known Allergies  History  Substance Use Topics  . Smoking status: Never Smoker   . Smokeless tobacco: Never Used  . Alcohol Use: 27.0 oz/week    45 Glasses of wine per week     Comment: 4-5 glasses of wine a night    Family History  Problem Relation Age of Onset  . Hypertension Mother   . Hypertension Father      Review of Systems  Positive ROS: neg  All other systems have been reviewed and were otherwise negative with the exception of those mentioned in the HPI and as above.  Objective: Vital signs in last 24 hours: Temp:  [98.5 F (36.9 C)] 98.5 F (36.9 C) (08/21 0811) Pulse Rate:  [57] 57 (08/21 0811) Resp:  [20] 20 (08/21 0811) BP: (140)/(95) 140/95 mmHg (08/21 0811) SpO2:  [99 %] 99 % (08/21 0811) Weight:  [73.483 kg (162 lb)] 73.483 kg (162 lb) (08/21 0811)  General Appearance: Alert, cooperative, no distress, appears stated age Head: Normocephalic, without obvious abnormality, atraumatic Eyes: PERRL, conjunctiva/corneas clear, EOM's intact    Neck: Supple, symmetrical, trachea midline Back: Symmetric, no curvature, ROM normal, no CVA tenderness Lungs:  respirations unlabored Heart: Regular rate and rhythm Abdomen: Soft, non-tender Extremities: Extremities normal, atraumatic, no cyanosis or edema Pulses: 2+ and symmetric all extremities Skin: Skin color, texture, turgor normal, no rashes or lesions  NEUROLOGIC:   Mental status: Alert and oriented x4,  no aphasia, good attention span, fund of knowledge, and memory Motor Exam - grossly normal Sensory Exam - grossly normal Reflexes: 1+ Coordination - grossly normal Gait - grossly normal Balance - grossly normal Cranial Nerves: I: smell Not tested  II: visual acuity  OS: nl    OD: nl  II: visual fields Full to confrontation  II: pupils Equal, round, reactive to light  III,VII: ptosis None  III,IV,VI: extraocular muscles  Full ROM  V: mastication Normal  V: facial light touch sensation  Normal   V,VII: corneal reflex  Present  VII: facial muscle function - upper  Normal  VII: facial muscle function - lower Normal  VIII: hearing Not tested  IX: soft palate elevation  Normal  IX,X: gag reflex Present  XI: trapezius strength  5/5  XI: sternocleidomastoid strength 5/5  XI: neck flexion strength  5/5  XII: tongue strength  Normal    Data Review Lab Results  Component Value Date   WBC 4.5 09/29/2013   HGB 12.6* 09/29/2013   HCT 36.6* 09/29/2013   MCV 96.8 09/29/2013   PLT 221 09/29/2013   Lab Results  Component Value Date   NA 138 09/29/2013   K 4.9 09/29/2013   CL 100 09/29/2013   CO2 26 09/29/2013   BUN 17 09/29/2013   CREATININE 1.16 09/29/2013   GLUCOSE 89 09/29/2013   Lab Results  Component Value Date   INR 1.02 09/29/2013    Assessment/Plan: Patient admitted for PLIF L1-2, L2-3, L3-4. Patient has failed a reasonable attempt at conservative therapy.  I explained the condition and procedure to the patient and answered any questions.  Patient wishes to proceed with procedure as planned. Understands risks/ benefits and typical outcomes of procedure.   Marlow Berenguer S 10/10/2013 9:22 AM

## 2013-10-10 NOTE — Progress Notes (Signed)
Pt rolling back and forth, groaning, DR Ronnald Ramp at bedside and valium orderd

## 2013-10-11 MED ORDER — SODIUM CHLORIDE 0.9 % IJ SOLN: 9.0000 mL | INTRAMUSCULAR | Status: DC | PRN

## 2013-10-11 MED ORDER — PANTOPRAZOLE SODIUM 40 MG PO TBEC
40.0000 mg | DELAYED_RELEASE_TABLET | Freq: Every day | ORAL | Status: DC
Start: 2013-10-11 — End: 2013-10-11

## 2013-10-11 MED ORDER — DIPHENHYDRAMINE HCL 12.5 MG/5ML PO ELIX: 12.5000 mg | ORAL_SOLUTION | Freq: Four times a day (QID) | ORAL | Status: DC | PRN

## 2013-10-11 MED ORDER — DIPHENHYDRAMINE HCL 50 MG/ML IJ SOLN: 12.5000 mg | Freq: Four times a day (QID) | INTRAMUSCULAR | Status: DC | PRN

## 2013-10-11 MED ORDER — HYDROMORPHONE 0.3 MG/ML IV SOLN
INTRAVENOUS | Status: DC
  Administered 2013-10-11: 0.999 mg via INTRAVENOUS
  Administered 2013-10-11: 0.3 mg via INTRAVENOUS
  Filled 2013-10-11: qty 25

## 2013-10-11 MED ORDER — ONDANSETRON HCL 4 MG/2ML IJ SOLN: 4.0000 mg | Freq: Four times a day (QID) | INTRAMUSCULAR | Status: DC | PRN

## 2013-10-11 MED ORDER — NALOXONE HCL 0.4 MG/ML IJ SOLN: 0.4000 mg | INTRAMUSCULAR | Status: DC | PRN

## 2013-10-11 NOTE — Progress Notes (Signed)
Filed Vitals:   10/11/13 0000 10/11/13 0126 10/11/13 0520 10/11/13 0543  BP:  106/65  118/57  Pulse:  70  72  Temp:  97.9 F (36.6 C)  99 F (37.2 C)  TempSrc:  Oral  Oral  Resp: 20 16 19 18   Weight:      SpO2:  100%  100%    Patient resting in bed, with head of bed at about 10. He is complaining of headache, and explains that the headache worsened significantly when he got up in the middle of the night to use the bathroom, again when he got up this morning to try and urinate. His Foley catheter was removed by the nursing staff at [place]. Patient's wife explains that Dr. Ronnald Ramp told her that there was a small nick in the dura that he did not feel needed to be sutured, and therefore he was placed on bedrest for 24 hours. Nursing staff reports dressing is clean and dry.  Plan: With postural headache, we'll continue bedrest, with head of bed flat, until further notice, having patient logrolled every 2 hours. We'll have Nursing Staff [address] Foley, if he is unable to void was flat in bed. Discussed situation with the patient and his wife.  Hosie Spangle, MD 10/11/2013, 8:36 AM

## 2013-10-11 NOTE — Progress Notes (Signed)
OT Cancellation Note  Patient Details Name: Andrew Suarez MRN: [id number] DOB: [birth year]   Cancelled Treatment:    Reason Eval/Treat Not Completed: Medical issues which prohibited therapy. Pt currently on bedrest.  [name]  OTR/L [phone]  10/11/2013, 8:36 AM

## 2013-10-11 NOTE — Plan of Care (Signed)
Problem: Consults Goal: Diagnosis - Spinal Surgery Outcome: Completed/Met Date Met:  10/11/13 Thoraco/Lumbar Spine Fusion

## 2013-10-11 NOTE — Progress Notes (Signed)
PT Cancellation Note  Patient Details Name: Andrew Suarez MRN: [id number] DOB: [birth year]   Cancelled Treatment:    Reason Eval/Treat Not Completed: Patient not medically ready.  Pt currently on bedrest.    Maicy Filip 10/11/2013, [place], [place] DPT [phone]

## 2013-10-12 NOTE — Progress Notes (Signed)
Patient ID: Andrew Suarez, male   DOB: 10/26/1955, 58 y.o.   MRN: [id number] Patient's feeling much. I no headache no nausea back pain leg pain is well managed Flat feels a little soreness in his back when he turns  Strength out of 5 wound clean and dry  Continue flat bedrest until tomorrow

## 2013-10-13 ENCOUNTER — Encounter (HOSPITAL_COMMUNITY): Payer: Self-pay | Admitting: Neurological Surgery

## 2013-10-13 MED FILL — Sodium Chloride IV Soln 0.9%: INTRAVENOUS | Qty: 2000 | Status: AC

## 2013-10-13 MED FILL — Heparin Sodium (Porcine) Inj 1000 Unit/ML: INTRAMUSCULAR | Qty: 30 | Status: AC

## 2013-10-13 NOTE — Progress Notes (Signed)
Utilization review completed. Fleetwood Pierron, RN, BSN. 

## 2013-10-13 NOTE — Evaluation (Signed)
Physical Therapy Evaluation Patient Details Name: Andrew Suarez MRN: [id number] DOB: February 27, 1955 Today's Date: 10/13/2013   History of Present Illness  Patient is a 58 y.o. male admitted for PLIF. Onset of symptoms was several years ago, gradually worsening since that time.  The pain is rated severe, and is located at the across the lower back and radiates to LLE. The pain is described as aching and occurs all day. The symptoms have been progressive. Symptoms are exacerbated by exercise and standing. MRI or CT showed multilevel spondylosis/ DDD/ scoliosis.   Pt. with dural tear on bedrest following surgery  Clinical Impression  Patient is s/p L1-4 PLIF with dural tear surgery resulting in the deficits listed below (see PT Problem List).  Patient will benefit from skilled PT to increase their independence and safety with mobility (while adhering to their precautions) to allow discharge     Follow Up Recommendations No PT follow up;Supervision/Assistance - 24 hour    Equipment Recommendations  None recommended by PT    Recommendations for Other Services       Precautions / Restrictions Precautions Precautions: Back Precaution Booklet Issued: No Precaution Comments: educated on precautions Required Braces or Orthoses: Spinal Brace Spinal Brace: Lumbar corset;Applied in sitting position (per pt. report) Restrictions Weight Bearing Restrictions: No      Mobility  Bed Mobility Overal bed mobility: Needs Assistance Bed Mobility: Sit to Sidelying;Rolling Rolling: Supervision       Sit to sidelying: Supervision;HOB elevated (with rail) General bed mobility comments: pt demonstrated proper technique  Transfers Overall transfer level: Needs assistance Equipment used: None Transfers: Sit to/from Stand Sit to Stand: Supervision            Ambulation/Gait Ambulation/Gait assistance: Supervision Ambulation Distance (Feet): 30 Feet Assistive device: None Gait  Pattern/deviations: WFL(Within Functional Limits) Gait velocity: decreased Gait velocity interpretation: Below normal speed for age/gender General Gait Details: gait WFL, however pt. c/o lightheadedness with mobility therefore limited activity  Stairs            Wheelchair Mobility    Modified Rankin (Stroke Patients Only)       Balance Overall balance assessment: Modified Independent                                           Pertinent Vitals/Pain Pain Assessment: 0-10 Pain Score: 5  Pain Location: back Pain Descriptors / Indicators: Constant;Dull;Sore Pain Intervention(s): Monitored during session;Repositioned    Home Living Family/patient expects to be discharged to:: Private residence Living Arrangements: Spouse/significant other Available Help at Discharge: Available 24 hours/day Type of Home: House Home Access: Stairs to enter Entrance Stairs-Rails: None Entrance Stairs-Number of Steps: 1 Home Layout: One level;Able to live on main level with bedroom/bathroom (with basement) Home Equipment: None      Prior Function Level of Independence: Independent               Hand Dominance        Extremity/Trunk Assessment   Upper Extremity Assessment: Defer to OT evaluation           Lower Extremity Assessment: Overall WFL for tasks assessed      Cervical / Trunk Assessment: Normal  Communication   Communication: No difficulties  Cognition Arousal/Alertness: Awake/alert Behavior During Therapy: WFL for tasks assessed/performed Overall Cognitive Status: Within Functional Limits for tasks assessed  General Comments General comments (skin integrity, edema, etc.): Pt's BP after standing at sink to brush teeth 82/56.  RN notifed by nurse tech.  Returned pt. to bed.    Exercises        Assessment/Plan    PT Assessment Patient needs continued PT services  PT Diagnosis Difficulty walking   PT  Problem List Decreased strength;Decreased activity tolerance;Decreased balance;Decreased knowledge of precautions;Pain  PT Treatment Interventions DME instruction;Gait training;Stair training;Functional mobility training;Therapeutic activities;Therapeutic exercise;Neuromuscular re-education;Balance training;Patient/family education   PT Goals (Current goals can be found in the Care Plan section) Acute Rehab PT Goals Patient Stated Goal: to go home; dance at daughter's wedding next year PT Goal Formulation: With patient Time For Goal Achievement: 10/20/13 Potential to Achieve Goals: Good    Frequency Min 5X/week   Barriers to discharge        Co-evaluation               End of Session Equipment Utilized During Treatment: Back brace Activity Tolerance: Treatment limited secondary to medical complications (Comment);Patient tolerated treatment well (lightheadedness) Patient left: in bed;with call bell/phone within reach Nurse Communication: Mobility status         Time: 3810-1751 PT Time Calculation (min): 32 min   Charges:   PT Evaluation $Initial PT Evaluation Tier I: 1 Procedure PT Treatments $Gait Training: 8-22 mins $Therapeutic Activity: 8-22 mins   PT G Codes:          Siri Cole 10/13/2013, 2:28 PM  Laureen Abrahams, PT, DPT [phone]

## 2013-10-13 NOTE — Progress Notes (Signed)
Patient ID: Andrew Suarez, male   DOB: [birth year], 58 y.o.   MRN: [id number] Subjective: Patient reports he is doing much better.  He doesn't have much back pain with lying still.  Pain or numbness tingling or weakness.  He has had some mild headache for the last few hours, but he feels this is from lying flat and still for 2 days.  Objective: Vital signs in last 24 hours: Temp:  [98.1 F (36.7 C)-98.6 F (37 C)] 98.4 F (36.9 C) (08/24 0634) Pulse Rate:  [58-68] 65 (08/24 0634) Resp:  [16-21] 20 (08/24 0634) BP: (99-133)/(61-73) 130/73 mmHg (08/24 0634) SpO2:  [97 %-100 %] 98 % (08/24 0634) Weight:  [75.5 kg (166 lb 7.2 oz)] 75.5 kg (166 lb 7.2 oz) (08/24 0634)  Intake/Output from previous day: 08/23 0701 - 08/24 0700 In: -  Out: 200 [Urine:200] Intake/Output this shift:    Neurologic: Grossly normal, awake alert and conversant, good strength in lower extremities in all muscle groups.  Dressing dry and flat.  Rolls in bed fairly easily.  I sat his head of bed up to 30 for 15 minutes and he did fine.  Lab Results: Lab Results  Component Value Date   WBC 4.5 09/29/2013   HGB 12.6* 09/29/2013   HCT 36.6* 09/29/2013   MCV 96.8 09/29/2013   PLT 221 09/29/2013   Lab Results  Component Value Date   INR 1.02 09/29/2013   BMET Lab Results  Component Value Date   NA 138 09/29/2013   K 4.9 09/29/2013   CL 100 09/29/2013   CO2 26 09/29/2013   GLUCOSE 89 09/29/2013   BUN 17 09/29/2013   CREATININE 1.16 09/29/2013   CALCIUM 9.0 09/29/2013    Studies/Results: No results found.  Assessment/Plan: We will mobilize him today.  If he has recurrent headache we will get him back flat.Continue pain control.  Saline lock IV.   LOS: 3 days    Andrew Suarez 10/13/2013, 8:26 AM

## 2013-10-14 LAB — POCT I-STAT 4, (NA,K, GLUC, HGB,HCT)
GLUCOSE: 173 mg/dL — AB (ref 70–99)
HCT: 28 % — ABNORMAL LOW (ref 39.0–52.0)
Hemoglobin: 9.5 g/dL — ABNORMAL LOW (ref 13.0–17.0)
POTASSIUM: 3.8 meq/L (ref 3.7–5.3)
Sodium: 132 mEq/L — ABNORMAL LOW (ref 137–147)

## 2013-10-14 LAB — CBC
HCT: 27.5 % — ABNORMAL LOW (ref 39.0–52.0)
HEMOGLOBIN: 9.7 g/dL — AB (ref 13.0–17.0)
MCH: 34 pg (ref 26.0–34.0)
MCHC: 35.3 g/dL (ref 30.0–36.0)
MCV: 96.5 fL (ref 78.0–100.0)
Platelets: 162 10*3/uL (ref 150–400)
RBC: 2.85 MIL/uL — ABNORMAL LOW (ref 4.22–5.81)
RDW: 12.1 % (ref 11.5–15.5)
WBC: 5.1 10*3/uL (ref 4.0–10.5)

## 2013-10-14 LAB — BASIC METABOLIC PANEL
Anion gap: 10 (ref 5–15)
BUN: 14 mg/dL (ref 6–23)
CO2: 26 mEq/L (ref 19–32)
Calcium: 8.3 mg/dL — ABNORMAL LOW (ref 8.4–10.5)
Chloride: 100 mEq/L (ref 96–112)
Creatinine, Ser: 0.94 mg/dL (ref 0.50–1.35)
GFR calc Af Amer: >90 mL/min (ref >90)
GFR calc non Af Amer: 89 mL/min — ABNORMAL LOW (ref >90)
GLUCOSE: 151 mg/dL — AB (ref 70–99)
POTASSIUM: 3.4 meq/L — AB (ref 3.7–5.3)
Sodium: 136 mEq/L — ABNORMAL LOW (ref 137–147)

## 2013-10-14 NOTE — Progress Notes (Signed)
Patient ID: TYELER GOEDKEN, male   DOB: September 04, 1955, 58 y.o.   MRN: [id number] Subjective: Patient reports light-headedness with standing, and his BP drops. NO leg pain or N/T/W. Mild headache that is not positional. Back appropriately sore.  Objective: Vital signs in last 24 hours: Temp:  [97.8 F (36.6 C)-99.1 F (37.3 C)] 99.1 F (37.3 C) (08/25 1021) Pulse Rate:  [70-76] 74 (08/25 1021) Resp:  [16-18] 17 (08/25 1021) BP: (101-155)/(59-72) 132/72 mmHg (08/25 1228) SpO2:  [100 %] 100 % (08/25 1021)  Intake/Output from previous day:   Intake/Output this shift: Total I/O In: 480 [P.O.:480] Out: -   Neurologic: Grossly normal, good strength, dressing dry and flat  Lab Results: Lab Results  Component Value Date   WBC 4.5 09/29/2013   HGB 9.5* 10/10/2013   HCT 28.0* 10/10/2013   MCV 96.8 09/29/2013   PLT 221 09/29/2013   Lab Results  Component Value Date   INR 1.02 09/29/2013   BMET Lab Results  Component Value Date   NA 132* 10/10/2013   K 3.8 10/10/2013   CL 100 09/29/2013   CO2 26 09/29/2013   GLUCOSE 173* 10/10/2013   BUN 17 09/29/2013   CREATININE 1.16 09/29/2013   CALCIUM 9.0 09/29/2013    Studies/Results: No results found.  Assessment/Plan: Orthostatic hypotension, likely multifactorial but I'm hoping this is not the manifestation of CSK leak. Hydrate, check hemoglobin/ lytes.    LOS: 4 days    Shalaunda Weatherholtz S 10/14/2013, 1:04 PM

## 2013-10-14 NOTE — Progress Notes (Signed)
Physical Therapy Treatment Patient Details Name: Andrew Suarez MRN: [id number] DOB: [birth year] Today's Date: 10/14/2013    History of Present Illness Patient is a 58 y.o. male admitted for PLIF. Onset of symptoms was several years ago, gradually worsening since that time.  The pain is rated severe, and is located at the across the lower back and radiates to LLE. The pain is described as aching and occurs all day. The symptoms have been progressive. Symptoms are exacerbated by exercise and standing. MRI or CT showed multilevel spondylosis/ DDD/ scoliosis.   Pt. with dural tear on bedrest following surgery    PT Comments    Pt limited by dizziness and headache with getting up to standing. RN made aware of BP reading's and pt report of headache. Pt returned to lying due to BP drop and dizziness with getting upright. Pt's mobility is good with bed mobility and transfers. Per pt/spouse report he walked around unit x2 last evening.  BP before session (lying down):135/79 BP seated at edge of bed: 104/74 BP with standing at edge of bed: 86/54 BP after lying back down: 117/58   Follow Up Recommendations  No PT follow up;Supervision/Assistance - 24 hour     Equipment Recommendations  None recommended by PT       Precautions / Restrictions Precautions Precautions: Back Precaution Comments: educated on precautions Required Braces or Orthoses: Spinal Brace Spinal Brace: Lumbar corset;Applied in sitting position (pt needs min assist with brace management) Restrictions Weight Bearing Restrictions: No    Mobility  Bed Mobility Overal bed mobility: Needs Assistance Bed Mobility: Sit to Sidelying;Sidelying to Sit Rolling: Supervision Sidelying to sit: Supervision     Sit to sidelying: Modified independent (Device/Increase time) General bed mobility comments: pt able to demo correct logroll technique with bed flat and rail used.  Transfers Overall transfer level: Needs  assistance Equipment used: None Transfers: Sit to/from Stand Sit to Stand: Supervision         General transfer comment: cues for hand placement with sit/stand transfers.  Ambulation/Gait                 Stairs            Wheelchair Mobility    Modified Rankin (Stroke Patients Only)          Cognition Arousal/Alertness: Awake/alert Behavior During Therapy: WFL for tasks assessed/performed Overall Cognitive Status: Within Functional Limits for tasks assessed                             Pertinent Vitals/Pain Pain Assessment: 0-10 Pain Score: 5  Pain Location: back. also has 4/10 headache. Pain Descriptors / Indicators: Sore;Aching Pain Intervention(s): Repositioned;Premedicated before session;Patient requesting pain meds-RN notified (for headache)           PT Goals (current goals can now be found in the care plan section) Acute Rehab PT Goals Patient Stated Goal: to go home; dance at daughter's wedding next year PT Goal Formulation: With patient Time For Goal Achievement: 10/20/13 Potential to Achieve Goals: Good Progress towards PT goals: Progressing toward goals    Frequency  Min 5X/week    PT Plan Current plan remains appropriate       End of Session Equipment Utilized During Treatment: Gait belt;Back brace Activity Tolerance: Treatment limited secondary to medical complications (Comment) Patient left: in bed;with call bell/phone within reach;with family/visitor present     Time: 2458-0998 PT Time Calculation (min): 26 min  Charges:  $Therapeutic Activity: 23-37 mins                    G Codes:      Willow Ora 2013-11-06, 12:18 PM  Willow Ora, PTA Office- [phone]

## 2013-10-14 NOTE — Progress Notes (Signed)
Patient ambulated well this morning multiple times with no complaints. However around 1030 RN was called by PT to assess patient as when he stood up from bed he felt dizzy and had a 5/10 headache. BP was taken and patient was orthostatic hypotensive with BP 135/79 while laying and 88/54 while standing. Patient c/o dizziness and 3/10 headache. Patient laid back down in bed and headache increased to 5/10. Gave patient oxycodone and hooked him back up to IVF. Laid patient flat for time being until symptoms resolve. BP did come back up to 126/72 a few minutes later. Instructed patient to inform nurse if symptoms worsen. Will notify Dr Ronnald Ramp upon rounds today.  Abigail Marsiglia, [place], RN 11:56 AM

## 2013-10-15 MED ORDER — SODIUM CHLORIDE 0.9 % IV BOLUS (SEPSIS)
1000.0000 mL | Freq: Once | INTRAVENOUS | Status: AC
  Administered 2013-10-15: 1000 mL via INTRAVENOUS

## 2013-10-15 NOTE — Progress Notes (Signed)
Patient ID: Andrew Suarez, male   DOB: 12/20/1955, 58 y.o.   MRN: [id number] Subjective: Patient reports he'Suarez doing well. No headache. No leg pain or N/T/W, back sore Objective: Vital signs in last 24 hours: Temp:  [97.7 F (36.5 C)-99 F (37.2 C)] 98.6 F (37 C) (08/26 1100) Pulse Rate:  [69-113] 80 (08/26 1100) Resp:  [17-18] 18 (08/26 1100) BP: (76-149)/(56-83) 87/68 mmHg (08/26 1128) SpO2:  [99 %-100 %] 100 % (08/26 1100)  Intake/Output from previous day: 08/25 0701 - 08/26 0700 In: 720 [P.O.:720] Out: -  Intake/Output this shift:    Neurologic: Grossly normal  Lab Results: Lab Results  Component Value Date   WBC 5.1 10/14/2013   HGB 9.7* 10/14/2013   HCT 27.5* 10/14/2013   MCV 96.5 10/14/2013   PLT 162 10/14/2013   Lab Results  Component Value Date   INR 1.02 09/29/2013   BMET Lab Results  Component Value Date   NA 136* 10/14/2013   K 3.4* 10/14/2013   CL 100 10/14/2013   CO2 26 10/14/2013   GLUCOSE 151* 10/14/2013   BUN 14 10/14/2013   CREATININE 0.94 10/14/2013   CALCIUM 8.3* 10/14/2013    Studies/Results: No results found.  Assessment/Plan: Doing very well other than orthostasis. Bolus NS and continue to watch   LOS: 5 days    Andrew Suarez 10/15/2013, 11:53 AM

## 2013-10-15 NOTE — Progress Notes (Signed)
Physical Therapy Treatment Patient Details Name: Andrew Suarez MRN: [id number] DOB: 11-15-1955 Today's Date: 10/15/2013    History of Present Illness Patient is a 58 y.o. male admitted for PLIF. Onset of symptoms was several years ago, gradually worsening since that time.  The pain is rated severe, and is located at the across the lower back and radiates to LLE. The pain is described as aching and occurs all day. The symptoms have been progressive. Symptoms are exacerbated by exercise and standing. MRI or CT showed multilevel spondylosis/ DDD/ scoliosis.   Pt. with dural tear on bedrest following surgery    PT Comments    Pt tolerated ambulation well but does not tolerate static standing without feeling dizzy/lightheaded. Pt remains orthostatic BP. Pt BP in standing prior to amb 88/61, standing after amb of 600' 76/56 and BP sitting after amb 87/62. Pt only reports dizziness during static standing not ambulation.    Follow Up Recommendations  No PT follow up;Supervision/Assistance - 24 hour     Equipment Recommendations  None recommended by PT    Recommendations for Other Services       Precautions / Restrictions Precautions Precautions: Back Precaution Booklet Issued: No Precaution Comments: pt able to recall prec Required Braces or Orthoses: Spinal Brace Spinal Brace: Lumbar corset;Applied in sitting position Restrictions Weight Bearing Restrictions: No    Mobility  Bed Mobility Overal bed mobility: Modified Independent Bed Mobility: Rolling;Sidelying to Sit Rolling: Modified independent (Device/Increase time) Sidelying to sit: Modified independent (Device/Increase time)       General bed mobility comments: pt with use of bed rail, good technique  Transfers Overall transfer level: Needs assistance Equipment used: None Transfers: Sit to/from Stand Sit to Stand: Supervision         General transfer comment: pt reports minimal  lightheadedness  Ambulation/Gait Ambulation/Gait assistance: Min guard Ambulation Distance (Feet): 600 Feet Assistive device: None Gait Pattern/deviations: Step-through pattern     General Gait Details: no episodes of dizziness unless he completed static standing, no episodes of LOB   Stairs Stairs: Yes Stairs assistance: Supervision Stair Management: Two rails Number of Stairs: 6 General stair comments: pt with good technique, no instability  Wheelchair Mobility    Modified Rankin (Stroke Patients Only)       Balance                                    Cognition Arousal/Alertness: Awake/alert Behavior During Therapy: WFL for tasks assessed/performed Overall Cognitive Status: Within Functional Limits for tasks assessed                      Exercises      General Comments        Pertinent Vitals/Pain Pain Assessment: 0-10 Pain Score: 3  Pain Location: surgical site Pain Descriptors / Indicators: Aching;Sore Pain Intervention(s): Monitored during session    Home Living                      Prior Function            PT Goals (current goals can now be found in the care plan section)      Frequency  Min 5X/week    PT Plan Current plan remains appropriate    Co-evaluation             End of Session Equipment Utilized During Treatment: Gait belt;Back  brace Activity Tolerance: Treatment limited secondary to medical complications (Comment) Patient left: in chair;with call bell/phone within reach;with family/visitor present     Time: 2703-5009 PT Time Calculation (min): 29 min  Charges:  $Gait Training: 23-37 mins                    G Codes:      Kingsley Callander 10/15/2013, 1:24 PM  Kittie Plater, PT, DPT Pager #: [phone] Office #: [phone]

## 2013-10-16 MED ORDER — OXYCODONE-ACETAMINOPHEN 10-325 MG PO TABS: ORAL_TABLET | ORAL | Status: DC

## 2013-10-16 MED ORDER — SODIUM CHLORIDE 0.9 % IV BOLUS (SEPSIS)
1000.0000 mL | Freq: Once | INTRAVENOUS | Status: AC
  Administered 2013-10-16: 1000 mL via INTRAVENOUS

## 2013-10-16 NOTE — Discharge Summary (Signed)
Physician Discharge Summary  Patient ID: Andrew Suarez MRN: [id number] DOB/AGE: 1955/06/27 58 y.o.  Admit date: 10/10/2013 Discharge date: 10/16/2013  Admission Diagnoses: scoliosis/ stenosis   Discharge Diagnoses: same   Discharged Condition: good  Hospital Course: The patient was admitted on 10/10/2013 and taken to the operating room where the patient underwent PLIF L1-2, L2-3, L3-4. The patient tolerated the procedure well and was taken to the recovery room and then to the floor in stable condition. He remained at flat bedrest for 2 days because of an unintended durotomy.  The hospital course was routine. There were no complications. The wound remained clean dry and intact. Pt had appropriate back soreness. No complaints of leg pain or new N/T/W. The patient remained afebrile with stable vital signs, and tolerated a regular diet. The patient continued to increase activities, and pain was well controlled with oral pain medications.   Consults: None  Significant Diagnostic Studies:  Results for orders placed during the hospital encounter of 10/10/13  CBC      Result Value Ref Range   WBC 5.1  4.0 - 10.5 K/uL   RBC 2.85 (*) 4.22 - 5.81 MIL/uL   Hemoglobin 9.7 (*) 13.0 - 17.0 g/dL   HCT 27.5 (*) 39.0 - 52.0 %   MCV 96.5  78.0 - 100.0 fL   MCH 34.0  26.0 - 34.0 pg   MCHC 35.3  30.0 - 36.0 g/dL   RDW 12.1  11.5 - 15.5 %   Platelets 162  150 - 400 K/uL  BASIC METABOLIC PANEL      Result Value Ref Range   Sodium 136 (*) 137 - 147 mEq/L   Potassium 3.4 (*) 3.7 - 5.3 mEq/L   Chloride 100  96 - 112 mEq/L   CO2 26  19 - 32 mEq/L   Glucose, Bld 151 (*) 70 - 99 mg/dL   BUN 14  6 - 23 mg/dL   Creatinine, Ser 0.94  0.50 - 1.35 mg/dL   Calcium 8.3 (*) 8.4 - 10.5 mg/dL   GFR calc non Af Amer 89 (*) >90 mL/min   GFR calc Af Amer >90  >90 mL/min   Anion gap 10  5 - 15  POCT I-STAT 4, (NA,K, GLUC, HGB,HCT)      Result Value Ref Range   Sodium 132 (*) 137 - 147 mEq/L   Potassium 3.8   3.7 - 5.3 mEq/L   Glucose, Bld 173 (*) 70 - 99 mg/dL   HCT 28.0 (*) 39.0 - 52.0 %   Hemoglobin 9.5 (*) 13.0 - 17.0 g/dL    Chest 2 View  09/29/2013   CLINICAL DATA:  Hypertension; preoperative evaluation  EXAM: CHEST  2 VIEW  COMPARISON:  February 01, 2012  FINDINGS: There is no edema or consolidation. The heart size and pulmonary vascularity are normal. No adenopathy. There is thoracolumbar levoscoliosis. There is degenerative change in the thoracic spine. There is postoperative change in the lower cervical spine.  IMPRESSION: No edema or consolidation.   Electronically Signed   By: Lowella Grip M.D.   On: 09/29/2013 08:58   Dg Lumbar Spine 2-3 Views  10/10/2013   CLINICAL DATA:  Three level lumbar fusion.  EXAM: LUMBAR SPINE - 2-3 VIEW; DG C-ARM 61-120 MIN  COMPARISON:  Lumbar CT myelogram 06/26/2013  FINDINGS: Single frontal and lateral intraoperative spot fluoroscopic images of the lumbar spine are provided. The frontal image demonstrates removal of connecting rods from the pre-existing L5 pedicle screws. Posterior decompression  and fusion has been performed at L1-2, L2-3, and L3-4 in the interim with new bilateral pedicle screws from L1-L3 and connecting rods from L1-L4 bilaterally. Interbody cages are present at each level from L1-2 to L4-5. Lumbar numbering performed on the lateral image. Posterior tissue spreaders are in place.  IMPRESSION: Intraoperative images during L1-L4 posterior lumbar interbody fusion.   Electronically Signed   By: Logan Bores   On: 10/10/2013 16:57   Dg C-arm 61-120 Min  10/10/2013   CLINICAL DATA:  Three level lumbar fusion.  EXAM: LUMBAR SPINE - 2-3 VIEW; DG C-ARM 61-120 MIN  COMPARISON:  Lumbar CT myelogram 06/26/2013  FINDINGS: Single frontal and lateral intraoperative spot fluoroscopic images of the lumbar spine are provided. The frontal image demonstrates removal of connecting rods from the pre-existing L5 pedicle screws. Posterior decompression and fusion  has been performed at L1-2, L2-3, and L3-4 in the interim with new bilateral pedicle screws from L1-L3 and connecting rods from L1-L4 bilaterally. Interbody cages are present at each level from L1-2 to L4-5. Lumbar numbering performed on the lateral image. Posterior tissue spreaders are in place.  IMPRESSION: Intraoperative images during L1-L4 posterior lumbar interbody fusion.   Electronically Signed   By: Logan Bores   On: 10/10/2013 16:57    Antibiotics:  Anti-infectives   Start     Dose/Rate Route Frequency Ordered Stop   10/10/13 1930  terbinafine (LAMISIL) tablet 250 mg     250 mg Oral Daily 10/10/13 1929     10/10/13 1930  ceFAZolin (ANCEF) IVPB 1 g/50 mL premix     1 g 100 mL/hr over 30 Minutes Intravenous Every 8 hours 10/10/13 1929 10/11/13 0459   10/10/13 1107  bacitracin 50,000 Units in sodium chloride irrigation 0.9 % 500 mL irrigation  Status:  Discontinued       As needed 10/10/13 1108 10/10/13 1652   10/10/13 0600  ceFAZolin (ANCEF) IVPB 2 g/50 mL premix     2 g 100 mL/hr over 30 Minutes Intravenous On call to O.R. 10/09/13 1413 10/10/13 1400      Discharge Exam: Blood pressure 139/75, pulse 70, temperature 99 F (37.2 C), temperature source Oral, resp. rate 20, height 5' 7.5" (1.715 m), weight 75.5 kg (166 lb 7.2 oz), SpO2 100.00%. Neurologic: Grossly normal Incision CDI  Discharge Medications:     Medication List    STOP taking these medications       amLODipine 5 MG tablet  Commonly known as:  NORVASC     atenolol 50 MG tablet  Commonly known as:  TENORMIN     diclofenac 75 MG EC tablet  Commonly known as:  VOLTAREN      TAKE these medications       buPROPion 100 MG tablet  Commonly known as:  WELLBUTRIN  Take 100 mg by mouth 2 (two) times daily.     multivitamin with minerals Tabs tablet  Take 1 tablet by mouth daily.     omeprazole 20 MG tablet  Commonly known as:  PRILOSEC OTC  Take 20 mg by mouth daily as needed. For heartburn      oxyCODONE-acetaminophen 10-325 MG per tablet  Commonly known as:  PERCOCET  1-2 po every 6 hours as needed for pain     terbinafine 250 MG tablet  Commonly known as:  LAMISIL  Take 250 mg by mouth daily.        Disposition: home   Final Dx: PLIF L1-2, L2-3, L3-4  Discharge Instructions   Call MD for:  difficulty breathing, headache or visual disturbances    Complete by:  As directed      Call MD for:  persistant nausea and vomiting    Complete by:  As directed      Call MD for:  redness, tenderness, or signs of infection (pain, swelling, redness, odor or green/yellow discharge around incision site)    Complete by:  As directed      Call MD for:  severe uncontrolled pain    Complete by:  As directed      Call MD for:  temperature >100.4    Complete by:  As directed      Diet - low sodium heart healthy    Complete by:  As directed      Discharge instructions    Complete by:  As directed   No bending or twisting, no driving, may shower, no driving     Increase activity slowly    Complete by:  As directed            Follow-up Information   Follow up with Mataeo Ingwersen S, MD. Schedule an appointment as soon as possible for a visit in 2 weeks.   Specialty:  Neurosurgery   Contact information:   [address] [place] [address] [phone]        Signed: Eustace Moore 10/16/2013, 8:12 AM

## 2013-11-28 ENCOUNTER — Other Ambulatory Visit: Payer: Self-pay | Admitting: Orthopedic Surgery

## 2013-11-28 DIAGNOSIS — M25511 Pain in right shoulder: Secondary | ICD-10-CM

## 2013-12-02 ENCOUNTER — Ambulatory Visit
Admission: RE | Admit: 2013-12-02 | Discharge: 2013-12-02 | Disposition: A | Discharge status: Home / Self Care | Payer: BC Managed Care – PPO | Source: Ambulatory Visit | Attending: Orthopedic Surgery | Admitting: Orthopedic Surgery

## 2013-12-02 DIAGNOSIS — M25511 Pain in right shoulder: Secondary | ICD-10-CM

## 2014-01-06 ENCOUNTER — Other Ambulatory Visit: Payer: Self-pay | Admitting: Neurological Surgery

## 2014-01-06 DIAGNOSIS — M545 Low back pain: Secondary | ICD-10-CM

## 2014-03-31 ENCOUNTER — Other Ambulatory Visit: Payer: Self-pay | Admitting: Neurological Surgery

## 2014-03-31 ENCOUNTER — Ambulatory Visit (INDEPENDENT_AMBULATORY_CARE_PROVIDER_SITE_OTHER): Payer: BC Managed Care – PPO

## 2014-03-31 DIAGNOSIS — Y839 Surgical procedure, unspecified as the cause of abnormal reaction of the patient, or of later complication, without mention of misadventure at the time of the procedure: Secondary | ICD-10-CM

## 2014-03-31 DIAGNOSIS — M5135 Other intervertebral disc degeneration, thoracolumbar region: Secondary | ICD-10-CM

## 2014-03-31 DIAGNOSIS — T84498A Other mechanical complication of other internal orthopedic devices, implants and grafts, initial encounter: Secondary | ICD-10-CM

## 2014-03-31 DIAGNOSIS — Z981 Arthrodesis status: Secondary | ICD-10-CM

## 2015-10-19 ENCOUNTER — Other Ambulatory Visit: Payer: Self-pay | Admitting: Neurological Surgery

## 2015-10-19 DIAGNOSIS — M545 Low back pain: Principal | ICD-10-CM

## 2015-10-19 DIAGNOSIS — G8929 Other chronic pain: Secondary | ICD-10-CM

## 2015-10-28 ENCOUNTER — Ambulatory Visit
Admission: RE | Admit: 2015-10-28 | Discharge: 2015-10-28 | Disposition: A | Discharge status: Home / Self Care | Payer: Commercial Managed Care - HMO | Source: Ambulatory Visit | Attending: Neurological Surgery | Admitting: Neurological Surgery

## 2015-10-28 VITALS — BP 122/79 | HR 61

## 2015-10-28 DIAGNOSIS — Z981 Arthrodesis status: Secondary | ICD-10-CM

## 2015-10-28 DIAGNOSIS — M545 Low back pain, unspecified: Secondary | ICD-10-CM

## 2015-10-28 DIAGNOSIS — G8929 Other chronic pain: Secondary | ICD-10-CM

## 2015-10-28 MED ORDER — IOPAMIDOL (ISOVUE-M 200) INJECTION 41%
15.0000 mL | Freq: Once | INTRAMUSCULAR | Status: AC
  Administered 2015-10-28: 15 mL via INTRATHECAL

## 2015-10-28 MED ORDER — DIAZEPAM 5 MG PO TABS
10.0000 mg | ORAL_TABLET | Freq: Once | ORAL | Status: AC
  Administered 2015-10-28: 10 mg via ORAL

## 2015-10-28 NOTE — Discharge Instructions (Signed)

## 2015-11-01 ENCOUNTER — Telehealth: Payer: Self-pay

## 2015-11-01 NOTE — Telephone Encounter (Signed)
Patient states he is doing fine after his myelogram here 10/28/15.  Ralene Bathe

## 2015-11-02 ENCOUNTER — Other Ambulatory Visit: Payer: Self-pay | Admitting: Neurological Surgery

## 2015-11-15 ENCOUNTER — Encounter (HOSPITAL_COMMUNITY): Payer: Self-pay

## 2015-11-15 NOTE — Pre-Procedure Instructions (Signed)
Andrew Suarez  11/15/2015      [id number] [place], [place] [address] [place] [address] Phone: [phone] Fax: [phone]  [facility] [place] [place] [address] [place] [address] Phone: [phone] Fax: [phone]    Your procedure is scheduled on Friday September 29.  Report to [facility] Admitting at 7:30 A.M.  Call this number if you have problems the morning of surgery:  [phone]   Remember:  Do not eat food or drink liquids after midnight.  Take these medicines the morning of surgery with A SIP OF WATER: amlodipine (norvasc), atenolol (Tenormin), Flexeril if needed, omeprazole (prilosec), oxycodone (percocet) if needed  7 days prior to surgery STOP taking any Aspirin, Aleve, Naproxen, Ibuprofen, Motrin, Advil, Goody's, BC's, all herbal medications, fish oil, and all vitamins    Do not wear jewelry  Do not wear lotions, powders, or colognes, or deoderant.  Men may shave face and neck.  Do not bring valuables to the hospital.  [facility] is not responsible for any belongings or valuables.  Contacts, dentures or bridgework may not be worn into surgery.  Leave your suitcase in the car.  After surgery it may be brought to your room.  For patients admitted to the hospital, discharge time will be determined by your treatment team.  Patients discharged the day of surgery will not be allowed to drive home.    Special instructions:     [place]- Preparing For Surgery  Before surgery, you can play an important role. Because skin is not sterile, your skin needs to be as free of germs as possible. You can reduce the number of germs on your skin by washing with CHG (chlorahexidine gluconate) Soap before surgery.  CHG is an antiseptic cleaner which kills germs and bonds with the skin to continue killing germs even after washing.  Please do not use if you have an allergy  to CHG or antibacterial soaps. If your skin becomes reddened/irritated stop using the CHG.  Do not shave (including legs and underarms) for at least 48 hours prior to first CHG shower. It is OK to shave your face.  Please follow these instructions carefully.   1. Shower the NIGHT BEFORE SURGERY and the MORNING OF SURGERY with CHG.   2. If you chose to wash your hair, wash your hair first as usual with your normal shampoo.  3. After you shampoo, rinse your hair and body thoroughly to remove the shampoo.  4. Use CHG as you would any other liquid soap. You can apply CHG directly to the skin and wash gently with a scrungie or a clean washcloth.   5. Apply the CHG Soap to your body ONLY FROM THE NECK DOWN.  Do not use on open wounds or open sores. Avoid contact with your eyes, ears, mouth and genitals (private parts). Wash genitals (private parts) with your normal soap.  6. Wash thoroughly, paying special attention to the area where your surgery will be performed.  7. Thoroughly rinse your body with warm water from the neck down.  8. DO NOT shower/wash with your normal soap after using and rinsing off the CHG Soap.  9. Pat yourself dry with a CLEAN TOWEL.   10. Wear CLEAN PAJAMAS   11. Place CLEAN SHEETS on your bed the night of your first shower and DO NOT SLEEP WITH PETS.    Day of Surgery:  Do not apply any deodorants/lotions. Please wear clean clothes to the hospital/surgery center.      Please read over the following fact sheets that you were given. MRSA Information

## 2015-11-16 ENCOUNTER — Encounter (HOSPITAL_COMMUNITY)
Admission: RE | Admit: 2015-11-16 | Discharge: 2015-11-16 | Disposition: A | Discharge status: Home / Self Care | Payer: Commercial Managed Care - HMO | Source: Ambulatory Visit | Attending: Neurological Surgery | Admitting: Neurological Surgery

## 2015-11-16 ENCOUNTER — Encounter (HOSPITAL_COMMUNITY): Payer: Self-pay

## 2015-11-16 DIAGNOSIS — Z96652 Presence of left artificial knee joint: Secondary | ICD-10-CM | POA: Diagnosis not present

## 2015-11-16 DIAGNOSIS — M960 Pseudarthrosis after fusion or arthrodesis: Secondary | ICD-10-CM | POA: Diagnosis present

## 2015-11-16 DIAGNOSIS — F419 Anxiety disorder, unspecified: Secondary | ICD-10-CM | POA: Diagnosis not present

## 2015-11-16 DIAGNOSIS — Y838 Other surgical procedures as the cause of abnormal reaction of the patient, or of later complication, without mention of misadventure at the time of the procedure: Secondary | ICD-10-CM | POA: Diagnosis not present

## 2015-11-16 DIAGNOSIS — I10 Essential (primary) hypertension: Secondary | ICD-10-CM | POA: Diagnosis not present

## 2015-11-16 DIAGNOSIS — K219 Gastro-esophageal reflux disease without esophagitis: Secondary | ICD-10-CM | POA: Diagnosis not present

## 2015-11-16 LAB — CBC
HEMATOCRIT: 40.6 % (ref 39.0–52.0)
Hemoglobin: 13.7 g/dL (ref 13.0–17.0)
MCH: 32.5 pg (ref 26.0–34.0)
MCHC: 33.7 g/dL (ref 30.0–36.0)
MCV: 96.4 fL (ref 78.0–100.0)
Platelets: 209 10*3/uL (ref 150–400)
RBC: 4.21 MIL/uL — AB (ref 4.22–5.81)
RDW: 11.8 % (ref 11.5–15.5)
WBC: 5.6 10*3/uL (ref 4.0–10.5)

## 2015-11-16 LAB — TYPE AND SCREEN
ABO/RH(D): A POS
ANTIBODY SCREEN: NEGATIVE

## 2015-11-16 LAB — COMPREHENSIVE METABOLIC PANEL
ALT: 42 U/L (ref 17–63)
AST: 36 U/L (ref 15–41)
Albumin: 3.8 g/dL (ref 3.5–5.0)
Alkaline Phosphatase: 42 U/L (ref 38–126)
Anion gap: 6 (ref 5–15)
BILIRUBIN TOTAL: 0.8 mg/dL (ref 0.3–1.2)
BUN: 13 mg/dL (ref 6–20)
CHLORIDE: 104 mmol/L (ref 101–111)
CO2: 26 mmol/L (ref 22–32)
Calcium: 9.4 mg/dL (ref 8.9–10.3)
Creatinine, Ser: 1.31 mg/dL — ABNORMAL HIGH (ref 0.61–1.24)
GFR, EST NON AFRICAN AMERICAN: 57 mL/min — AB (ref >60)
Glucose, Bld: 113 mg/dL — ABNORMAL HIGH (ref 65–99)
POTASSIUM: 4.2 mmol/L (ref 3.5–5.1)
Sodium: 136 mmol/L (ref 135–145)
TOTAL PROTEIN: 6.4 g/dL — AB (ref 6.5–8.1)

## 2015-11-16 LAB — SURGICAL PCR SCREEN
MRSA, PCR: NEGATIVE
STAPHYLOCOCCUS AUREUS: NEGATIVE

## 2015-11-16 NOTE — Progress Notes (Signed)
PCP: Dr. Larose Hires at [facility].  EKG: 11/16/15  Pt with no complaints of chest pain, SOB, or signs of infection at PAT appointment.

## 2015-11-19 ENCOUNTER — Inpatient Hospital Stay (HOSPITAL_COMMUNITY): Payer: Commercial Managed Care - HMO | Admitting: Anesthesiology

## 2015-11-19 ENCOUNTER — Encounter (HOSPITAL_COMMUNITY)
Admission: RE | Disposition: A | Discharge status: Home / Self Care | Payer: Self-pay | Source: Ambulatory Visit | Attending: Neurological Surgery

## 2015-11-19 ENCOUNTER — Observation Stay (HOSPITAL_COMMUNITY)
Admission: RE | Admit: 2015-11-19 | Discharge: 2015-11-19 | Disposition: A | Discharge status: Home / Self Care | Payer: Commercial Managed Care - HMO | Source: Ambulatory Visit | Attending: Neurological Surgery | Admitting: Neurological Surgery

## 2015-11-19 ENCOUNTER — Encounter (HOSPITAL_COMMUNITY): Payer: Self-pay | Admitting: Neurological Surgery

## 2015-11-19 DIAGNOSIS — S32009K Unspecified fracture of unspecified lumbar vertebra, subsequent encounter for fracture with nonunion: Secondary | ICD-10-CM | POA: Diagnosis present

## 2015-11-19 DIAGNOSIS — Z96652 Presence of left artificial knee joint: Secondary | ICD-10-CM | POA: Insufficient documentation

## 2015-11-19 DIAGNOSIS — M960 Pseudarthrosis after fusion or arthrodesis: Secondary | ICD-10-CM | POA: Diagnosis not present

## 2015-11-19 DIAGNOSIS — F419 Anxiety disorder, unspecified: Secondary | ICD-10-CM | POA: Insufficient documentation

## 2015-11-19 DIAGNOSIS — K219 Gastro-esophageal reflux disease without esophagitis: Secondary | ICD-10-CM | POA: Insufficient documentation

## 2015-11-19 DIAGNOSIS — I10 Essential (primary) hypertension: Secondary | ICD-10-CM | POA: Insufficient documentation

## 2015-11-19 DIAGNOSIS — Y838 Other surgical procedures as the cause of abnormal reaction of the patient, or of later complication, without mention of misadventure at the time of the procedure: Secondary | ICD-10-CM | POA: Insufficient documentation

## 2015-11-19 SURGERY — POSTERIOR LUMBAR FUSION 1 WITH HARDWARE REMOVAL
Anesthesia: General | Site: Back

## 2015-11-19 MED ORDER — SUCCINYLCHOLINE CHLORIDE 20 MG/ML IJ SOLN
INTRAMUSCULAR | Status: DC | PRN
  Administered 2015-11-19: 120 mg via INTRAVENOUS

## 2015-11-19 MED ORDER — OXYCODONE-ACETAMINOPHEN 5-325 MG PO TABS
1.0000 | ORAL_TABLET | ORAL | Status: DC | PRN
  Administered 2015-11-19: 2 via ORAL
  Filled 2015-11-19: qty 2

## 2015-11-19 MED ORDER — MIDAZOLAM HCL 2 MG/2ML IJ SOLN
INTRAMUSCULAR | Status: AC
  Filled 2015-11-19: qty 2

## 2015-11-19 MED ORDER — SODIUM CHLORIDE 0.9% FLUSH: 3.0000 mL | Freq: Two times a day (BID) | INTRAVENOUS | Status: DC

## 2015-11-19 MED ORDER — MENTHOL 3 MG MT LOZG: 1.0000 | LOZENGE | OROMUCOSAL | Status: DC | PRN

## 2015-11-19 MED ORDER — FENTANYL CITRATE (PF) 100 MCG/2ML IJ SOLN
INTRAMUSCULAR | Status: AC
  Filled 2015-11-19: qty 2

## 2015-11-19 MED ORDER — CYCLOBENZAPRINE HCL 10 MG PO TABS: 10.0000 mg | ORAL_TABLET | Freq: Three times a day (TID) | ORAL | 0 refills | Status: AC | PRN

## 2015-11-19 MED ORDER — AMLODIPINE BESYLATE 5 MG PO TABS: 5.0000 mg | ORAL_TABLET | Freq: Every day | ORAL | Status: DC

## 2015-11-19 MED ORDER — PROPOFOL 10 MG/ML IV BOLUS
INTRAVENOUS | Status: DC | PRN
  Administered 2015-11-19: 200 mg via INTRAVENOUS

## 2015-11-19 MED ORDER — SODIUM CHLORIDE 0.9% FLUSH: 3.0000 mL | INTRAVENOUS | Status: DC | PRN

## 2015-11-19 MED ORDER — FENTANYL CITRATE (PF) 100 MCG/2ML IJ SOLN
25.0000 ug | INTRAMUSCULAR | Status: DC | PRN
  Administered 2015-11-19 (×2): 50 ug via INTRAVENOUS

## 2015-11-19 MED ORDER — ONDANSETRON HCL 4 MG/2ML IJ SOLN
INTRAMUSCULAR | Status: AC
  Filled 2015-11-19: qty 2

## 2015-11-19 MED ORDER — THROMBIN 20000 UNITS EX SOLR
CUTANEOUS | Status: DC | PRN
  Administered 2015-11-19: 12:00:00 via TOPICAL

## 2015-11-19 MED ORDER — ACETAMINOPHEN 650 MG RE SUPP: 650.0000 mg | RECTAL | Status: DC | PRN

## 2015-11-19 MED ORDER — PROMETHAZINE HCL 25 MG/ML IJ SOLN: 6.2500 mg | INTRAMUSCULAR | Status: DC | PRN

## 2015-11-19 MED ORDER — LACTATED RINGERS IV SOLN
INTRAVENOUS | Status: DC | PRN
  Administered 2015-11-19 (×2): via INTRAVENOUS

## 2015-11-19 MED ORDER — CEFAZOLIN IN D5W 1 GM/50ML IV SOLN: 1.0000 g | Freq: Three times a day (TID) | INTRAVENOUS | Status: DC

## 2015-11-19 MED ORDER — VANCOMYCIN HCL 1000 MG IV SOLR
INTRAVENOUS | Status: AC
  Filled 2015-11-19: qty 1000

## 2015-11-19 MED ORDER — CELECOXIB 200 MG PO CAPS
200.0000 mg | ORAL_CAPSULE | Freq: Two times a day (BID) | ORAL | Status: DC
  Administered 2015-11-19: 200 mg via ORAL
  Filled 2015-11-19: qty 1

## 2015-11-19 MED ORDER — MORPHINE SULFATE (PF) 2 MG/ML IV SOLN: 1.0000 mg | INTRAVENOUS | Status: DC | PRN

## 2015-11-19 MED ORDER — OXYCODONE-ACETAMINOPHEN 5-325 MG PO TABS: 1.0000 | ORAL_TABLET | Freq: Four times a day (QID) | ORAL | 0 refills | Status: AC | PRN

## 2015-11-19 MED ORDER — FENTANYL CITRATE (PF) 100 MCG/2ML IJ SOLN
INTRAMUSCULAR | Status: AC
  Filled 2015-11-19: qty 4

## 2015-11-19 MED ORDER — ROCURONIUM BROMIDE 10 MG/ML (PF) SYRINGE
PREFILLED_SYRINGE | INTRAVENOUS | Status: AC
  Filled 2015-11-19: qty 10

## 2015-11-19 MED ORDER — CEFAZOLIN SODIUM-DEXTROSE 2-3 GM-% IV SOLR
INTRAVENOUS | Status: DC | PRN
  Administered 2015-11-19: 2 g via INTRAVENOUS

## 2015-11-19 MED ORDER — 0.9 % SODIUM CHLORIDE (POUR BTL) OPTIME
TOPICAL | Status: DC | PRN
  Administered 2015-11-19: 1000 mL

## 2015-11-19 MED ORDER — SUGAMMADEX SODIUM 200 MG/2ML IV SOLN
INTRAVENOUS | Status: DC | PRN
  Administered 2015-11-19: 200 mg via INTRAVENOUS

## 2015-11-19 MED ORDER — PROPOFOL 10 MG/ML IV BOLUS
INTRAVENOUS | Status: AC
  Filled 2015-11-19: qty 20

## 2015-11-19 MED ORDER — POTASSIUM CHLORIDE IN NACL 20-0.9 MEQ/L-% IV SOLN: INTRAVENOUS | Status: DC

## 2015-11-19 MED ORDER — LIDOCAINE HCL (CARDIAC) 20 MG/ML IV SOLN
INTRAVENOUS | Status: DC | PRN
  Administered 2015-11-19: 80 mg via INTRAVENOUS

## 2015-11-19 MED ORDER — ATENOLOL 50 MG PO TABS
50.0000 mg | ORAL_TABLET | Freq: Every day | ORAL | Status: DC
Start: 2015-11-20 — End: 2015-11-19

## 2015-11-19 MED ORDER — CYCLOBENZAPRINE HCL 10 MG PO TABS: 10.0000 mg | ORAL_TABLET | Freq: Three times a day (TID) | ORAL | Status: DC | PRN

## 2015-11-19 MED ORDER — ROCURONIUM BROMIDE 100 MG/10ML IV SOLN
INTRAVENOUS | Status: DC | PRN
  Administered 2015-11-19: 40 mg via INTRAVENOUS
  Administered 2015-11-19: 20 mg via INTRAVENOUS

## 2015-11-19 MED ORDER — EPHEDRINE SULFATE 50 MG/ML IJ SOLN
INTRAMUSCULAR | Status: DC | PRN
  Administered 2015-11-19 (×5): 10 mg via INTRAVENOUS

## 2015-11-19 MED ORDER — LIDOCAINE 2% (20 MG/ML) 5 ML SYRINGE
INTRAMUSCULAR | Status: AC
  Filled 2015-11-19: qty 5

## 2015-11-19 MED ORDER — PHENYLEPHRINE HCL 10 MG/ML IJ SOLN
INTRAVENOUS | Status: DC | PRN
  Administered 2015-11-19: 20 ug/min via INTRAVENOUS

## 2015-11-19 MED ORDER — BUPIVACAINE HCL (PF) 0.25 % IJ SOLN
INTRAMUSCULAR | Status: DC | PRN
  Administered 2015-11-19: 6 mL

## 2015-11-19 MED ORDER — PHENOL 1.4 % MT LIQD: 1.0000 | OROMUCOSAL | Status: DC | PRN

## 2015-11-19 MED ORDER — GELATIN ABSORBABLE MT POWD
OROMUCOSAL | Status: DC | PRN
  Administered 2015-11-19: 12:00:00 via TOPICAL

## 2015-11-19 MED ORDER — ONDANSETRON HCL 4 MG/2ML IJ SOLN: 4.0000 mg | INTRAMUSCULAR | Status: DC | PRN

## 2015-11-19 MED ORDER — PHENYLEPHRINE HCL 10 MG/ML IJ SOLN
INTRAMUSCULAR | Status: DC | PRN
  Administered 2015-11-19: 80 ug via INTRAVENOUS
  Administered 2015-11-19: 120 ug via INTRAVENOUS
  Administered 2015-11-19: 80 ug via INTRAVENOUS

## 2015-11-19 MED ORDER — FENTANYL CITRATE (PF) 100 MCG/2ML IJ SOLN
INTRAMUSCULAR | Status: DC | PRN
  Administered 2015-11-19 (×2): 50 ug via INTRAVENOUS
  Administered 2015-11-19: 100 ug via INTRAVENOUS

## 2015-11-19 MED ORDER — ACETAMINOPHEN 325 MG PO TABS: 650.0000 mg | ORAL_TABLET | ORAL | Status: DC | PRN

## 2015-11-19 MED ORDER — SODIUM CHLORIDE 0.9 % IR SOLN
Status: DC | PRN
  Administered 2015-11-19: 13:00:00

## 2015-11-19 MED ORDER — PHENYLEPHRINE 40 MCG/ML (10ML) SYRINGE FOR IV PUSH (FOR BLOOD PRESSURE SUPPORT)
PREFILLED_SYRINGE | INTRAVENOUS | Status: AC
  Filled 2015-11-19: qty 10

## 2015-11-19 MED ORDER — ONDANSETRON HCL 4 MG/2ML IJ SOLN
INTRAMUSCULAR | Status: DC | PRN
  Administered 2015-11-19: 4 mg via INTRAVENOUS

## 2015-11-19 MED ORDER — MIDAZOLAM HCL 5 MG/5ML IJ SOLN
INTRAMUSCULAR | Status: DC | PRN
Start: 2015-11-19 — End: 2015-11-19
  Administered 2015-11-19: 2 mg via INTRAVENOUS

## 2015-11-19 MED ORDER — GLYCOPYRROLATE 0.2 MG/ML IJ SOLN
INTRAMUSCULAR | Status: DC | PRN
  Administered 2015-11-19: 0.3 mg via INTRAVENOUS

## 2015-11-19 MED ORDER — CEFAZOLIN SODIUM-DEXTROSE 2-4 GM/100ML-% IV SOLN
INTRAVENOUS | Status: AC
  Filled 2015-11-19: qty 100

## 2015-11-19 MED ORDER — DEXAMETHASONE SODIUM PHOSPHATE 10 MG/ML IJ SOLN
INTRAMUSCULAR | Status: DC | PRN
  Administered 2015-11-19: 10 mg via INTRAVENOUS

## 2015-11-19 SURGICAL SUPPLY — 58 items
BAG DECANTER FOR FLEXI CONT (MISCELLANEOUS) ×2 IMPLANT
BENZOIN TINCTURE PRP APPL 2/3 (GAUZE/BANDAGES/DRESSINGS) ×2 IMPLANT
BLADE CLIPPER SURG (BLADE) IMPLANT
BONE CANC CHIPS 20CC PCAN1/4 (Bone Implant) ×2 IMPLANT
BONE MATRIX OSTEOCEL PRO SM (Bone Implant) ×4 IMPLANT
BUR MATCHSTICK NEURO 3.0 LAGG (BURR) ×2 IMPLANT
CANISTER SUCT 3000ML PPV (MISCELLANEOUS) ×2 IMPLANT
CHIPS CANC BONE 20CC PCAN1/4 (Bone Implant) ×1 IMPLANT
CONT SPEC 4OZ CLIKSEAL STRL BL (MISCELLANEOUS) ×2 IMPLANT
COVER BACK TABLE 60X90IN (DRAPES) ×2 IMPLANT
DERMABOND ADVANCED (GAUZE/BANDAGES/DRESSINGS) ×1
DERMABOND ADVANCED .7 DNX12 (GAUZE/BANDAGES/DRESSINGS) ×1 IMPLANT
DRAPE C-ARM 42X72 X-RAY (DRAPES) ×4 IMPLANT
DRAPE LAPAROTOMY 100X72X124 (DRAPES) ×2 IMPLANT
DRAPE POUCH INSTRU U-SHP 10X18 (DRAPES) ×2 IMPLANT
DRAPE SURG 17X23 STRL (DRAPES) ×2 IMPLANT
DRSG OPSITE POSTOP 4X8 (GAUZE/BANDAGES/DRESSINGS) ×2 IMPLANT
DURAPREP 26ML APPLICATOR (WOUND CARE) ×2 IMPLANT
ELECT REM PT RETURN 9FT ADLT (ELECTROSURGICAL) ×2
ELECTRODE REM PT RTRN 9FT ADLT (ELECTROSURGICAL) ×1 IMPLANT
EVACUATOR 1/8 PVC DRAIN (DRAIN) ×2 IMPLANT
GAUZE SPONGE 4X4 16PLY XRAY LF (GAUZE/BANDAGES/DRESSINGS) IMPLANT
GLOVE BIO SURGEON STRL SZ8 (GLOVE) ×6 IMPLANT
GLOVE BIO SURGEON STRL SZ8.5 (GLOVE) ×2 IMPLANT
GLOVE BIOGEL PI IND STRL 7.0 (GLOVE) ×1 IMPLANT
GLOVE BIOGEL PI IND STRL 7.5 (GLOVE) ×1 IMPLANT
GLOVE BIOGEL PI IND STRL 8 (GLOVE) ×1 IMPLANT
GLOVE BIOGEL PI INDICATOR 7.0 (GLOVE) ×1
GLOVE BIOGEL PI INDICATOR 7.5 (GLOVE) ×1
GLOVE BIOGEL PI INDICATOR 8 (GLOVE) ×1
GLOVE ECLIPSE 7.5 STRL STRAW (GLOVE) ×6 IMPLANT
GLOVE SURG SS PI 7.0 STRL IVOR (GLOVE) ×2 IMPLANT
GOWN STRL REUS W/ TWL LRG LVL3 (GOWN DISPOSABLE) IMPLANT
GOWN STRL REUS W/ TWL XL LVL3 (GOWN DISPOSABLE) ×3 IMPLANT
GOWN STRL REUS W/TWL 2XL LVL3 (GOWN DISPOSABLE) ×2 IMPLANT
GOWN STRL REUS W/TWL LRG LVL3 (GOWN DISPOSABLE)
GOWN STRL REUS W/TWL XL LVL3 (GOWN DISPOSABLE) ×3
HEMOSTAT POWDER KIT SURGIFOAM (HEMOSTASIS) ×2 IMPLANT
KIT BASIN OR (CUSTOM PROCEDURE TRAY) ×2 IMPLANT
KIT ROOM TURNOVER OR (KITS) ×2 IMPLANT
MILL MEDIUM DISP (BLADE) IMPLANT
NEEDLE ASP BONE MRW 8GX15 (NEEDLE) ×2 IMPLANT
NEEDLE HYPO 25X1 1.5 SAFETY (NEEDLE) ×2 IMPLANT
NS IRRIG 1000ML POUR BTL (IV SOLUTION) ×2 IMPLANT
PACK LAMINECTOMY NEURO (CUSTOM PROCEDURE TRAY) ×2 IMPLANT
PAD ARMBOARD 7.5X6 YLW CONV (MISCELLANEOUS) ×6 IMPLANT
SPONGE LAP 4X18 X RAY DECT (DISPOSABLE) IMPLANT
SPONGE SURGIFOAM ABS GEL 100 (HEMOSTASIS) ×2 IMPLANT
STRIP CLOSURE SKIN 1/2X4 (GAUZE/BANDAGES/DRESSINGS) ×2 IMPLANT
SUT VIC AB 0 CT1 18XCR BRD8 (SUTURE) ×2 IMPLANT
SUT VIC AB 0 CT1 8-18 (SUTURE) ×2
SUT VIC AB 2-0 CP2 18 (SUTURE) ×2 IMPLANT
SUT VIC AB 3-0 SH 8-18 (SUTURE) ×4 IMPLANT
TOWEL OR 17X24 6PK STRL BLUE (TOWEL DISPOSABLE) ×2 IMPLANT
TOWEL OR 17X26 10 PK STRL BLUE (TOWEL DISPOSABLE) ×2 IMPLANT
TRAP SPECIMEN MUCOUS 40CC (MISCELLANEOUS) ×2 IMPLANT
TRAY FOLEY W/METER SILVER 16FR (SET/KITS/TRAYS/PACK) IMPLANT
WATER STERILE IRR 1000ML POUR (IV SOLUTION) ×2 IMPLANT

## 2015-11-19 NOTE — Anesthesia Postprocedure Evaluation (Signed)
Anesthesia Post Note  Patient: JAKWON HACKE  Procedure(s) Performed: Procedure(s) (LRB): LUMBAR Three-FourPOSTERIOR LATERAL FUSION, REMOVAL OF HARDWARE Lumbar One throug Four (N/A)  Patient location during evaluation: PACU Anesthesia Type: General Level of consciousness: awake and alert Pain management: pain level controlled Vital Signs Assessment: post-procedure vital signs reviewed and stable Respiratory status: spontaneous breathing, nonlabored ventilation, respiratory function stable and patient connected to nasal cannula oxygen Cardiovascular status: blood pressure returned to baseline and stable Postop Assessment: no signs of nausea or vomiting Anesthetic complications: no    Last Vitals:  Vitals:   11/19/15 1400 11/19/15 1415  BP: (!) 136/91 (!) 140/91  Pulse: 72 70  Resp: 18 12  Temp:      Last Pain:  Vitals:   11/19/15 1415  TempSrc:   PainSc: Asleep                 Reginal Lutes

## 2015-11-19 NOTE — Op Note (Signed)
11/19/2015  1:26 PM  PATIENT:  Andrew Suarez  60 y.o. male  PRE-OPERATIVE DIAGNOSIS:  Pseudoarthrosis L3-4 with instrumentation failure, back and leg pain  POST-OPERATIVE DIAGNOSIS:  Same  PROCEDURE:  1. Lumbar reexploration with exploration of fusion L3-4 to confirm pseudoarthrosis, 2. Removal of segmental instrumentation L1-L4, 3. Posterior lateral arthrodesis L3-4 utilizing morcellized allograft soaked with a bone marrow aspirate obtained through a separate fascial incision from the right iliac crest  SURGEON:  Sherley Bounds, MD  ASSISTANTS: Dr. Arnoldo Morale  ANESTHESIA:   General  EBL: 50 ml  Total I/O In: 1000 [I.V.:1000] Out: 50 [Blood:50]  BLOOD ADMINISTERED:none  DRAINS: none  SPECIMEN:  No Specimen  INDICATION FOR PROCEDURE: This patient presented with back and leg pain. CT myelogram showed broken screws at L4 with a pseudoarthrosis at L3-4 with solid unions at L1-2 and L2-3. He tried medical management without relief. I recommended lumbar reexploration with extraction of hardware followed by posterior lateral fusion L3-4. Patient understood the risks, benefits, and alternatives and potential outcomes and wished to proceed.  PROCEDURE DETAILS: The patient was taken to the operating room and after induction of adequate generalized endotracheal anesthesia, the patient was rolled into the prone position on the Wilson frame and all pressure points were padded. The lumbar region was cleaned and then prepped with DuraPrep and draped in the usual sterile fashion. 5 cc of local anesthesia was injected and then a dorsal midline incision was made and carried down to the lumbo sacral fascia. The fascia was opened and the paraspinous musculature was taken down in a subperiosteal fashion to expose the previously placed hardware. The locking caps were removed, the rod was then removed and the screws were removed successively. I then was able to explore the fusion by a removing the facets of  L3-4 by pushing on the bony prominences and noticed there was continued motion between L3 and L4. I dissected in a suprafascial plane to the right iliac crest and open the fascia over the crest. Used to needle to remove about 15 mL of bone marrow aspirate from the right iliac crest and soaked a morcellized allograft with this. I then closed the fascia over the iliac crest. I dissected out over the facets of L3-4 and identified the transverse processes of L3 and L4. I decorticated these and placed a mixture of morcellized allograft to perform arthrodesis L3-4 bilaterally. I irrigated with saline solution containing bacitracin. Achieved hemostasis with bipolar cautery, and then closed the fascia with 0 Vicryl. I closed the subcutaneous tissues with 2-0 Vicryl and the subcuticular tissues with 3-0 Vicryl. The skin was then closed with benzoin and Steri-Strips. The drapes were removed, a sterile dressing was applied. The patient was awakened from general anesthesia and transferred to the recovery room in stable condition. At the end of the procedure all sponge, needle and instrument counts were correct.   PLAN OF CARE: Admit for overnight observation  PATIENT DISPOSITION:  PACU - hemodynamically stable.   Delay start of Pharmacological VTE agent (>24hrs) due to surgical blood loss or risk of bleeding:  yes

## 2015-11-19 NOTE — Anesthesia Procedure Notes (Addendum)
Procedure Name: Intubation Date/Time: 11/19/2015 11:56 AM Performed by: Laretta Alstrom Pre-anesthesia Checklist: Patient identified, Emergency Drugs available, Suction available and Patient being monitored Patient Re-evaluated:Patient Re-evaluated prior to inductionOxygen Delivery Method: Circle System Utilized Preoxygenation: Pre-oxygenation with 100% oxygen Intubation Type: IV induction Ventilation: Mask ventilation without difficulty Laryngoscope Size: Mac and 3 Grade View: Grade I Tube type: Oral Tube size: 7.5 mm Number of attempts: 1 Airway Equipment and Method: Stylet and Oral airway Placement Confirmation: ETT inserted through vocal cords under direct vision,  positive ETCO2 and breath sounds checked- equal and bilateral Secured at: 22 cm Tube secured with: Tape Dental Injury: Teeth and Oropharynx as per pre-operative assessment

## 2015-11-19 NOTE — Discharge Summary (Signed)
Physician Discharge Summary  Patient ID: Andrew Suarez MRN: [id number] DOB/AGE: Jun 29, 1955 60 y.o.  Admit date: 11/19/2015 Discharge date: 11/19/2015  Admission Diagnoses: pseudoarthrosis/ instrumentation failure   Discharge Diagnoses: same   Discharged Condition: good  Hospital Course: The patient was admitted on 11/19/2015 and taken to the operating room where the patient underwent hardware extraction/ fusion. The patient tolerated the procedure well and was taken to the recovery room and then to the floor in stable condition. The hospital course was routine. There were no complications. The wound remained clean dry and intact. Pt had appropriate back soreness. No complaints of leg pain or new N/T/W. The patient remained afebrile with stable vital signs, and tolerated a regular diet. The patient continued to increase activities, and pain was well controlled with oral pain medications.   Consults: None  Significant Diagnostic Studies:  Results for orders placed or performed during the hospital encounter of 11/16/15  Surgical pcr screen  Result Value Ref Range   MRSA, PCR NEGATIVE NEGATIVE   Staphylococcus aureus NEGATIVE NEGATIVE  CBC  Result Value Ref Range   WBC 5.6 4.0 - 10.5 K/uL   RBC 4.21 (L) 4.22 - 5.81 MIL/uL   Hemoglobin 13.7 13.0 - 17.0 g/dL   HCT 40.6 39.0 - 52.0 %   MCV 96.4 78.0 - 100.0 fL   MCH 32.5 26.0 - 34.0 pg   MCHC 33.7 30.0 - 36.0 g/dL   RDW 11.8 11.5 - 15.5 %   Platelets 209 150 - 400 K/uL  Comprehensive metabolic panel  Result Value Ref Range   Sodium 136 135 - 145 mmol/L   Potassium 4.2 3.5 - 5.1 mmol/L   Chloride 104 101 - 111 mmol/L   CO2 26 22 - 32 mmol/L   Glucose, Bld 113 (H) 65 - 99 mg/dL   BUN 13 6 - 20 mg/dL   Creatinine, Ser 1.31 (H) 0.61 - 1.24 mg/dL   Calcium 9.4 8.9 - 10.3 mg/dL   Total Protein 6.4 (L) 6.5 - 8.1 g/dL   Albumin 3.8 3.5 - 5.0 g/dL   AST 36 15 - 41 U/L   ALT 42 17 - 63 U/L   Alkaline Phosphatase 42 38 - 126 U/L    Total Bilirubin 0.8 0.3 - 1.2 mg/dL   GFR calc non Af Amer 57 (L) >60 mL/min   GFR calc Af Amer >60 >60 mL/min   Anion gap 6 5 - 15  Type and screen  Result Value Ref Range   ABO/RH(D) A POS    Antibody Screen NEG    Sample Expiration 11/30/2015    Extend sample reason NO TRANSFUSIONS OR PREGNANCY IN THE PAST 3 MONTHS     Ct Lumbar Spine W Contrast  Result Date: 10/28/2015 CLINICAL DATA:  Previous lumbar fusions. Hardware failure. Low back stabbing pain radiating to the right leg. EXAM: LUMBAR MYELOGRAM FLUOROSCOPY TIME:  1 minutes 0 seconds. 273.22 micro gray meter squared PROCEDURE: After thorough discussion of risks and benefits of the procedure including bleeding, infection, injury to nerves, blood vessels, adjacent structures as well as headache and CSF leak, written and oral informed consent was obtained. Consent was obtained by Dr. Nelson Chimes. Time out form was completed. Patient was positioned prone on the fluoroscopy table. Local anesthesia was provided with 1% lidocaine without epinephrine after prepped and draped in the usual sterile fashion. Puncture was performed at L5-S1 using a 3 1/2 inch 22-gauge spinal needle via left para median approach. Using a single pass through  the dura, the needle was placed within the thecal sac, with return of clear CSF. 15 mL of Isovue 200 was injected into the thecal sac, with normal opacification of the nerve roots and cauda equina consistent with free flow within the subarachnoid space. I personally performed the lumbar puncture and administered the intrathecal contrast. I also personally performed acquisition of the myelogram images. TECHNIQUE: Contiguous axial images were obtained through the Lumbar spine after the intrathecal infusion of infusion. Coronal and sagittal reconstructions were obtained of the axial image sets. COMPARISON:  Previous radiography.  Previous myelogram 06/26/2013 FINDINGS: LUMBAR MYELOGRAM FINDINGS: Previous discectomy and fusion  procedure from L1-L5. L5 hardware was removed. There is no central canal stenosis. There are small anterior extradural defects at T12-L1 and L3-4. L4-5 shows fix anterolisthesis of 3 mm. Both L4 screws are fractured. The L5-S1 level appears normal. Standing lateral flexion extension views do not demonstrate any motion. No evidence of focal nerve root compression. CT LUMBAR MYELOGRAM FINDINGS: T11-12: Not completely imaged. Shallow disc protrusion without likely neural compression. T12-L1: Shallow disc protrusion indents the ventral thecal sac but does not appear to cause neural compression. L1-2: Previous discectomy, posterior decompression and fusion. I think fusion is solid at this level. L1 screws well positioned. At L2, the right screws well position. The left screw does reach the lateral pedicle cortex. L2-3: Previous discectomy, posterior decompression and fusion. I think fusion is solid at this level. Wide patency of the canal and foramina. Pedicle screws at L3 satisfactory without evidence of motion or loosening. L3-4: Nonunion. No stenosis of the canal or foramina. L4 pedicle screws are fractured an additionally show surrounding lucency consistent with motion. L4-5: Previous posterior decompression, diskectomy and fusion. Indeterminate for solid union. It appears the solid union had probably occurred, but there is a lucency running through the fusion region that would seem to indicate failure of that union. No stenosis or neural compression evident. L5-S1: Bulging of the disc. Bilateral facet arthropathy. No compressive canal or foraminal stenosis. Sacroiliac osteoarthritis bilaterally. IMPRESSION: Diskectomy, decompression and fusion appears solid at L1-2 and L2-3. Disc protrusions at T11-12 and T12-L1 without neural compression. Nonunion at L3-4. No stenosis or neural compression. Fracture of the L4 pedicle screws bilaterally with lucency surrounding the attached portions indicating ongoing motion. At  L4-5, I think there was solid union, but there is a lucency running through the fusion bone that would appear to indicate failure of the union. No stenosis or neural compression. L5-S1 disc bulge and facet arthropathy. No stenosis or neural compression. Sacroiliac osteoarthritis bilaterally. Electronically Signed   By: Nelson Chimes M.D.   On: 10/28/2015 11:55   Dg Myelography Lumbar Inj Lumbosacral  Result Date: 10/28/2015 CLINICAL DATA:  Previous lumbar fusions. Hardware failure. Low back stabbing pain radiating to the right leg. EXAM: LUMBAR MYELOGRAM FLUOROSCOPY TIME:  1 minutes 0 seconds. 273.22 micro gray meter squared PROCEDURE: After thorough discussion of risks and benefits of the procedure including bleeding, infection, injury to nerves, blood vessels, adjacent structures as well as headache and CSF leak, written and oral informed consent was obtained. Consent was obtained by Dr. Nelson Chimes. Time out form was completed. Patient was positioned prone on the fluoroscopy table. Local anesthesia was provided with 1% lidocaine without epinephrine after prepped and draped in the usual sterile fashion. Puncture was performed at L5-S1 using a 3 1/2 inch 22-gauge spinal needle via left para median approach. Using a single pass through the dura, the needle was placed within the  thecal sac, with return of clear CSF. 15 mL of Isovue 200 was injected into the thecal sac, with normal opacification of the nerve roots and cauda equina consistent with free flow within the subarachnoid space. I personally performed the lumbar puncture and administered the intrathecal contrast. I also personally performed acquisition of the myelogram images. TECHNIQUE: Contiguous axial images were obtained through the Lumbar spine after the intrathecal infusion of infusion. Coronal and sagittal reconstructions were obtained of the axial image sets. COMPARISON:  Previous radiography.  Previous myelogram 06/26/2013 FINDINGS: LUMBAR MYELOGRAM  FINDINGS: Previous discectomy and fusion procedure from L1-L5. L5 hardware was removed. There is no central canal stenosis. There are small anterior extradural defects at T12-L1 and L3-4. L4-5 shows fix anterolisthesis of 3 mm. Both L4 screws are fractured. The L5-S1 level appears normal. Standing lateral flexion extension views do not demonstrate any motion. No evidence of focal nerve root compression. CT LUMBAR MYELOGRAM FINDINGS: T11-12: Not completely imaged. Shallow disc protrusion without likely neural compression. T12-L1: Shallow disc protrusion indents the ventral thecal sac but does not appear to cause neural compression. L1-2: Previous discectomy, posterior decompression and fusion. I think fusion is solid at this level. L1 screws well positioned. At L2, the right screws well position. The left screw does reach the lateral pedicle cortex. L2-3: Previous discectomy, posterior decompression and fusion. I think fusion is solid at this level. Wide patency of the canal and foramina. Pedicle screws at L3 satisfactory without evidence of motion or loosening. L3-4: Nonunion. No stenosis of the canal or foramina. L4 pedicle screws are fractured an additionally show surrounding lucency consistent with motion. L4-5: Previous posterior decompression, diskectomy and fusion. Indeterminate for solid union. It appears the solid union had probably occurred, but there is a lucency running through the fusion region that would seem to indicate failure of that union. No stenosis or neural compression evident. L5-S1: Bulging of the disc. Bilateral facet arthropathy. No compressive canal or foraminal stenosis. Sacroiliac osteoarthritis bilaterally. IMPRESSION: Diskectomy, decompression and fusion appears solid at L1-2 and L2-3. Disc protrusions at T11-12 and T12-L1 without neural compression. Nonunion at L3-4. No stenosis or neural compression. Fracture of the L4 pedicle screws bilaterally with lucency surrounding the attached  portions indicating ongoing motion. At L4-5, I think there was solid union, but there is a lucency running through the fusion bone that would appear to indicate failure of the union. No stenosis or neural compression. L5-S1 disc bulge and facet arthropathy. No stenosis or neural compression. Sacroiliac osteoarthritis bilaterally. Electronically Signed   By: Nelson Chimes M.D.   On: 10/28/2015 11:55    Antibiotics:  Anti-infectives    Start     Dose/Rate Route Frequency Ordered Stop   11/19/15 2200  ceFAZolin (ANCEF) IVPB 1 g/50 mL premix     1 g 100 mL/hr over 30 Minutes Intravenous Every 8 hours 11/19/15 1518 11/20/15 1359   11/19/15 1300  bacitracin 50,000 Units in sodium chloride irrigation 0.9 % 500 mL irrigation  Status:  Discontinued       As needed 11/19/15 1301 11/19/15 1328   11/19/15 1127  vancomycin (VANCOCIN) 1000 MG powder  Status:  Discontinued    Comments:  Daye, Devonia   : cabinet override      11/19/15 1127 11/19/15 1338   11/19/15 0854  ceFAZolin (ANCEF) 2-4 GM/100ML-% IVPB    Comments:  Maryjean Ka   : cabinet override      11/19/15 0854 11/19/15 2059      Discharge  Exam: Blood pressure (!) 133/91, pulse 70, temperature 97.7 F (36.5 C), resp. rate 18, height 5\' 8"  (1.727 m), weight 80.8 kg (178 lb 1.6 oz), SpO2 100 %. Neurologic: Grossly normal Dressing dry  Discharge Medications:     Medication List    TAKE these medications   amLODipine 5 MG tablet Commonly known as:  NORVASC Take 5 mg by mouth daily.   atenolol 50 MG tablet Commonly known as:  TENORMIN Take 50 mg by mouth daily.   cyclobenzaprine 10 MG tablet Commonly known as:  FLEXERIL Take 1 tablet (10 mg total) by mouth 3 (three) times daily as needed for muscle spasms. What changed:  when to take this   multivitamin with minerals Tabs tablet Take 1 tablet by mouth daily.   omeprazole 20 MG tablet Commonly known as:  PRILOSEC OTC Take 20 mg by mouth daily as needed. For heartburn    oxyCODONE-acetaminophen 5-325 MG tablet Commonly known as:  PERCOCET/ROXICET Take 1-2 tablets by mouth every 6 (six) hours as needed for severe pain. What changed:  how much to take  when to take this   pravastatin 20 MG tablet Commonly known as:  PRAVACHOL Take 20 mg by mouth daily.       Disposition: home   Final Dx: fusion/ instrumentation extraction  Discharge Instructions     Remove dressing in 72 hours    Complete by:  As directed    Call MD for:  difficulty breathing, headache or visual disturbances    Complete by:  As directed    Call MD for:  persistant nausea and vomiting    Complete by:  As directed    Call MD for:  redness, tenderness, or signs of infection (pain, swelling, redness, odor or green/yellow discharge around incision site)    Complete by:  As directed    Call MD for:  severe uncontrolled pain    Complete by:  As directed    Call MD for:  temperature >100.4    Complete by:  As directed    Diet - low sodium heart healthy    Complete by:  As directed    Increase activity slowly    Complete by:  As directed          Signed: JONES,DAVID S 11/19/2015, 4:00 PM

## 2015-11-19 NOTE — Progress Notes (Signed)
Patient alert and oriented, mae's well, voiding adequate amount of urine, swallowing without difficulty, c/o mild pain. Patient discharged home with family. Script and discharged instructions given to patient. Patient and family stated understanding of instructions given.   

## 2015-11-19 NOTE — Transfer of Care (Signed)
Immediate Anesthesia Transfer of Care Note  Patient: Andrew Suarez  Procedure(s) Performed: Procedure(s) with comments: LUMBAR Three-FourPOSTERIOR LATERAL FUSION, REMOVAL OF HARDWARE Lumbar One throug Four (N/A) - LUMBAR Three-FourPOSTERIOR LATERAL FUSION, REMOVAL OF HARDWARE Lumbar One throug Four  Patient Location: PACU  Anesthesia Type:General  Level of Consciousness: awake, alert , oriented and patient cooperative  Airway & Oxygen Therapy: Patient Spontanous Breathing and Patient connected to nasal cannula oxygen  Post-op Assessment: Report given to RN and Post -op Vital signs reviewed and stable  Post vital signs: Reviewed and stable  Last Vitals:  Vitals:   11/19/15 0808  BP: 123/81  Pulse: (!) 57  Resp: 18  Temp: 36.9 C    Last Pain:  Vitals:   11/19/15 0907  TempSrc:   PainSc: 4          Complications: No apparent anesthesia complications

## 2015-11-19 NOTE — Anesthesia Preprocedure Evaluation (Addendum)
Anesthesia Evaluation  Patient identified by MRN, date of birth, ID band Patient awake    Reviewed: Allergy & Precautions, H&P , NPO status , Patient's Chart, lab work & pertinent test results  Airway Mallampati: II  TM Distance: >3 FB Neck ROM: full    Dental no notable dental hx.    Pulmonary neg pulmonary ROS,    Pulmonary exam normal        Cardiovascular hypertension, Pt. on medications and Pt. on home beta blockers Normal cardiovascular exam     Neuro/Psych Anxiety    GI/Hepatic GERD  Medicated,  Endo/Other    Renal/GU CRFRenal disease     Musculoskeletal   Abdominal   Peds  Hematology   Anesthesia Other Findings   Reproductive/Obstetrics                            Anesthesia Physical  Anesthesia Plan  ASA: III  Anesthesia Plan: General   Post-op Pain Management:    Induction: Intravenous  Airway Management Planned: Oral ETT  Additional Equipment:   Intra-op Plan:   Post-operative Plan: Extubation in OR  Informed Consent: I have reviewed the patients History and Physical, chart, labs and discussed the procedure including the risks, benefits and alternatives for the proposed anesthesia with the patient or authorized representative who has indicated his/her understanding and acceptance.   Dental advisory given  Plan Discussed with: CRNA, Anesthesiologist and Surgeon  Anesthesia Plan Comments:         Anesthesia Quick Evaluation

## 2015-11-19 NOTE — H&P (Signed)
Subjective: Patient is a 60 y.o. male admitted for back pain. Onset of symptoms was several weeks ago, rapidly worsening since that time.  The pain is rated severe, and is located at the across the lower back and radiates to legs. The pain is described as aching and occurs all day. The symptoms have been progressive. Symptoms are exacerbated by exercise. MRI or CT showed broken instrumentation and pseudoarthrosis.   Past Medical History:  Diagnosis Date  . Anxiety   . GERD (gastroesophageal reflux disease)   . Hepatitis A   . Hypertension    does not see a cardiolgist  . Toxoplasmosis    left neck    Past Surgical History:  Procedure Laterality Date  . arthoscopy     knee  . BACK SURGERY     lumbar  . COLONOSCOPY     pre-cancerous polyps  . ELBOW SURGERY     bone removal  . INGUINAL HERNIA REPAIR     left  . MAXIMUM ACCESS (MAS)POSTERIOR LUMBAR INTERBODY FUSION (PLIF) 3 LEVEL N/A 10/10/2013   Procedure: LUMBAR ONE-TWO,LUMBAR TWO-THREE,LUMBAR THREE-FOUR MAXIMUM ACCESS SURGERY POSTERIOR LUMBAR INTERBODY FUSION,REMOVAL OF HARDWARE LUMBAR FOUR-FIVE;  Surgeon: Eustace Moore, MD;  Location: [facility];  Service: Neurosurgery;  Laterality: N/A;  . neck fusion    . ROTATOR CUFF REPAIR     left   . TOTAL KNEE ARTHROPLASTY     left  . VASECTOMY      Prior to Admission medications   Medication Sig Start Date End Date Taking? Authorizing Provider  amLODipine (NORVASC) 5 MG tablet Take 5 mg by mouth daily.   Yes Historical Provider, MD  atenolol (TENORMIN) 50 MG tablet Take 50 mg by mouth daily.   Yes Historical Provider, MD  cyclobenzaprine (FLEXERIL) 10 MG tablet Take 10 mg by mouth 2 (two) times daily as needed for muscle spasms.    Yes Historical Provider, MD  Multiple Vitamin (MULTIVITAMIN WITH MINERALS) TABS Take 1 tablet by mouth daily.   Yes Historical Provider, MD  omeprazole (PRILOSEC OTC) 20 MG tablet Take 20 mg by mouth daily as needed. For heartburn   Yes Historical Provider,  MD  oxyCODONE-acetaminophen (PERCOCET/ROXICET) 5-325 MG tablet Take 1 tablet by mouth 2 (two) times daily as needed for severe pain.    Yes Historical Provider, MD  pravastatin (PRAVACHOL) 20 MG tablet Take 20 mg by mouth daily. 09/02/15  Yes Historical Provider, MD   No Known Allergies  Social History  Substance Use Topics  . Smoking status: Never Smoker  . Smokeless tobacco: Never Used  . Alcohol use 27.0 oz/week    45 Glasses of wine per week     Comment: 3-4 glasses of wine a night    Family History  Problem Relation Age of Onset  . Hypertension Mother   . Hypertension Father      Review of Systems  Positive ROS: neg  All other systems have been reviewed and were otherwise negative with the exception of those mentioned in the HPI and as above.  Objective: Vital signs in last 24 hours:    General Appearance: Alert, cooperative, no distress, appears stated age Head: Normocephalic, without obvious abnormality, atraumatic Eyes: PERRL, conjunctiva/corneas clear, EOM's intact    Neck: Supple, symmetrical, trachea midline Back: Symmetric, no curvature, ROM normal, no CVA tenderness Lungs:  respirations unlabored Heart: Regular rate and rhythm Abdomen: Soft, non-tender Extremities: Extremities normal, atraumatic, no cyanosis or edema Pulses: 2+ and symmetric all extremities Skin: Skin  color, texture, turgor normal, no rashes or lesions  NEUROLOGIC:   Mental status: Alert and oriented x4,  no aphasia, good attention span, fund of knowledge, and memory Motor Exam - grossly normal Sensory Exam - grossly normal Reflexes: 1+ Coordination - grossly normal Gait - grossly normal Balance - grossly normal Cranial Nerves: I: smell Not tested  II: visual acuity  OS: nl    OD: nl  II: visual fields Full to confrontation  II: pupils Equal, round, reactive to light  III,VII: ptosis None  III,IV,VI: extraocular muscles  Full ROM  V: mastication Normal  V: facial light touch  sensation  Normal  V,VII: corneal reflex  Present  VII: facial muscle function - upper  Normal  VII: facial muscle function - lower Normal  VIII: hearing Not tested  IX: soft palate elevation  Normal  IX,X: gag reflex Present  XI: trapezius strength  5/5  XI: sternocleidomastoid strength 5/5  XI: neck flexion strength  5/5  XII: tongue strength  Normal    Data Review Lab Results  Component Value Date   WBC 5.6 11/16/2015   HGB 13.7 11/16/2015   HCT 40.6 11/16/2015   MCV 96.4 11/16/2015   PLT 209 11/16/2015   Lab Results  Component Value Date   NA 136 11/16/2015   K 4.2 11/16/2015   CL 104 11/16/2015   CO2 26 11/16/2015   BUN 13 11/16/2015   CREATININE 1.31 (H) 11/16/2015   GLUCOSE 113 (H) 11/16/2015   Lab Results  Component Value Date   INR 1.02 09/29/2013    Assessment/Plan: Patient admitted for hardware removal and PLF. Patient has failed a reasonable attempt at conservative therapy.  I explained the condition and procedure to the patient and answered any questions.  Patient wishes to proceed with procedure as planned. Understands risks/ benefits and typical outcomes of procedure.   Rumor Sun S 11/19/2015 6:43 AM

## 2017-02-02 ENCOUNTER — Other Ambulatory Visit: Payer: Self-pay | Admitting: General Surgery

## 2017-02-05 ENCOUNTER — Other Ambulatory Visit: Payer: Self-pay | Admitting: Neurological Surgery

## 2017-02-05 DIAGNOSIS — M545 Low back pain: Secondary | ICD-10-CM

## 2017-02-07 ENCOUNTER — Ambulatory Visit
Admission: RE | Admit: 2017-02-07 | Discharge: 2017-02-07 | Disposition: A | Discharge status: Home / Self Care | Payer: Commercial Managed Care - HMO | Source: Ambulatory Visit | Attending: Neurological Surgery | Admitting: Neurological Surgery

## 2017-02-07 DIAGNOSIS — M545 Low back pain: Secondary | ICD-10-CM

## 2018-05-23 DIAGNOSIS — M5414 Radiculopathy, thoracic region: Secondary | ICD-10-CM | POA: Diagnosis not present

## 2018-05-29 DIAGNOSIS — M5136 Other intervertebral disc degeneration, lumbar region: Secondary | ICD-10-CM | POA: Diagnosis not present

## 2018-05-29 DIAGNOSIS — Z981 Arthrodesis status: Secondary | ICD-10-CM | POA: Diagnosis not present

## 2018-06-03 DIAGNOSIS — M961 Postlaminectomy syndrome, not elsewhere classified: Secondary | ICD-10-CM | POA: Diagnosis not present

## 2018-06-03 DIAGNOSIS — M79604 Pain in right leg: Secondary | ICD-10-CM | POA: Diagnosis not present

## 2018-06-03 DIAGNOSIS — M48061 Spinal stenosis, lumbar region without neurogenic claudication: Secondary | ICD-10-CM | POA: Diagnosis not present

## 2018-06-14 DIAGNOSIS — M961 Postlaminectomy syndrome, not elsewhere classified: Secondary | ICD-10-CM | POA: Diagnosis not present

## 2018-07-11 DIAGNOSIS — M5416 Radiculopathy, lumbar region: Secondary | ICD-10-CM | POA: Diagnosis not present

## 2018-08-15 DIAGNOSIS — M5136 Other intervertebral disc degeneration, lumbar region: Secondary | ICD-10-CM | POA: Diagnosis not present

## 2018-08-15 DIAGNOSIS — Z6825 Body mass index (BMI) 25.0-25.9, adult: Secondary | ICD-10-CM | POA: Diagnosis not present

## 2018-08-15 DIAGNOSIS — Z981 Arthrodesis status: Secondary | ICD-10-CM | POA: Diagnosis not present

## 2018-08-15 DIAGNOSIS — I10 Essential (primary) hypertension: Secondary | ICD-10-CM | POA: Diagnosis not present

## 2018-10-05 ENCOUNTER — Other Ambulatory Visit: Payer: Self-pay

## 2018-10-05 ENCOUNTER — Emergency Department (HOSPITAL_BASED_OUTPATIENT_CLINIC_OR_DEPARTMENT_OTHER)
Admission: EM | Admit: 2018-10-05 | Discharge: 2018-10-05 | Disposition: A | Discharge status: Home / Self Care | Payer: Medicare Other | Attending: Emergency Medicine | Admitting: Emergency Medicine

## 2018-10-05 DIAGNOSIS — I10 Essential (primary) hypertension: Secondary | ICD-10-CM | POA: Diagnosis not present

## 2018-10-05 DIAGNOSIS — T63441A Toxic effect of venom of bees, accidental (unintentional), initial encounter: Secondary | ICD-10-CM

## 2018-10-05 DIAGNOSIS — Z79899 Other long term (current) drug therapy: Secondary | ICD-10-CM | POA: Diagnosis not present

## 2018-10-05 DIAGNOSIS — T7840XA Allergy, unspecified, initial encounter: Secondary | ICD-10-CM

## 2018-10-05 DIAGNOSIS — T782XXA Anaphylactic shock, unspecified, initial encounter: Secondary | ICD-10-CM | POA: Insufficient documentation

## 2018-10-05 MED ORDER — EPINEPHRINE 0.3 MG/0.3ML IJ SOAJ: 0.3000 mg | INTRAMUSCULAR | 0 refills | Status: AC | PRN

## 2018-10-05 MED ORDER — PREDNISONE 50 MG PO TABS: 50.0000 mg | ORAL_TABLET | Freq: Every day | ORAL | 0 refills | Status: AC

## 2018-10-05 MED ORDER — FAMOTIDINE IN NACL 20-0.9 MG/50ML-% IV SOLN
20.0000 mg | Freq: Once | INTRAVENOUS | Status: AC
  Administered 2018-10-05: 20 mg via INTRAVENOUS
  Filled 2018-10-05: qty 50

## 2018-10-05 MED ORDER — METHYLPREDNISOLONE SODIUM SUCC 125 MG IJ SOLR
125.0000 mg | Freq: Once | INTRAMUSCULAR | Status: AC
  Administered 2018-10-05: 125 mg via INTRAVENOUS
  Filled 2018-10-05: qty 2

## 2018-10-05 NOTE — ED Triage Notes (Signed)
Patient states that he was stung by a bee about 30 - 45 minutes ago. The patient reports that he is itching all over and has redness in his groin. He reports that his lips feel funny - he states that he drank about 4 oz of Childrens benadryl  - no noted distress in triage

## 2018-10-05 NOTE — ED Provider Notes (Signed)
[facility] Provider Note   CSN: [id number] Arrival date & time: 10/05/18  1436     History   Chief Complaint Chief Complaint  Patient presents with  . Insect Bite    HPI Andrew Suarez is a 63 y.o. male.     The history is provided by the patient and medical records. No language interpreter was used.  Allergic Reaction Presenting symptoms: itching, rash and swelling   Presenting symptoms: no difficulty breathing and no wheezing   Severity:  Moderate Duration:  1 hour Prior allergic episodes:  Insect allergies Context: insect bite/sting   Relieved by:  Nothing Worsened by:  Nothing Ineffective treatments:  None tried   Past Medical History:  Diagnosis Date  . Anxiety   . GERD (gastroesophageal reflux disease)   . Hepatitis A   . Hypertension    does not see a cardiolgist  . Toxoplasmosis    left neck    Patient Active Problem List   Diagnosis Date Noted  . Pseudoarthrosis of lumbar spine 11/19/2015  . S/P lumbar spinal fusion 10/10/2013    Past Surgical History:  Procedure Laterality Date  . arthoscopy     knee  . BACK SURGERY     lumbar  . COLONOSCOPY     pre-cancerous polyps  . ELBOW SURGERY     bone removal  . INGUINAL HERNIA REPAIR     left  . MAXIMUM ACCESS (MAS)POSTERIOR LUMBAR INTERBODY FUSION (PLIF) 3 LEVEL N/A 10/10/2013   Procedure: LUMBAR ONE-TWO,LUMBAR TWO-THREE,LUMBAR THREE-FOUR MAXIMUM ACCESS SURGERY POSTERIOR LUMBAR INTERBODY FUSION,REMOVAL OF HARDWARE LUMBAR FOUR-FIVE;  Surgeon: Eustace Moore, MD;  Location: [facility];  Service: Neurosurgery;  Laterality: N/A;  . neck fusion    . ROTATOR CUFF REPAIR     left   . TOTAL KNEE ARTHROPLASTY     left  . VASECTOMY          Home Medications    Prior to Admission medications   Medication Sig Start Date End Date Taking? Authorizing Provider  gabapentin (NEURONTIN) 300 MG capsule Take 300 mg by mouth 3 (three) times daily.   Yes [provider]  amLODipine (NORVASC) 5 MG tablet Take 5 mg by mouth daily.    [provider]  atenolol (TENORMIN) 50 MG tablet Take 50 mg by mouth daily.    [provider]  cyclobenzaprine (FLEXERIL) 10 MG tablet Take 1 tablet (10 mg total) by mouth 3 (three) times daily as needed for muscle spasms. 11/19/15   Eustace Moore, MD  Multiple Vitamin (MULTIVITAMIN WITH MINERALS) TABS Take 1 tablet by mouth daily.    [provider]  omeprazole (PRILOSEC OTC) 20 MG tablet Take 20 mg by mouth daily as needed. For heartburn    [provider]  oxyCODONE-acetaminophen (PERCOCET/ROXICET) 5-325 MG tablet Take 1-2 tablets by mouth every 6 (six) hours as needed for severe pain. 11/19/15   Eustace Moore, MD  pravastatin (PRAVACHOL) 20 MG tablet Take 20 mg by mouth daily. 09/02/15   [provider]    Family History Family History  Problem Relation Age of Onset  . Hypertension Mother   . Hypertension Father     Social History Social History   Tobacco Use  . Smoking status: Never Smoker  . Smokeless tobacco: Never Used  Substance Use Topics  . Alcohol use: Yes    Alcohol/week: 45.0 standard drinks    Types: 45 Glasses of wine per week  Comment: 3-4 glasses of wine a night  . Drug use: No     Allergies   Patient has no known allergies.   Review of Systems Review of Systems  Constitutional: Negative for chills, diaphoresis, fatigue and fever.  HENT: Negative for congestion.   Eyes: Negative for visual disturbance.  Respiratory: Negative for cough, chest tightness, shortness of breath, wheezing and stridor.   Cardiovascular: Negative for chest pain.  Gastrointestinal: Negative for abdominal pain, constipation, diarrhea, nausea and vomiting.  Genitourinary: Negative for frequency.  Musculoskeletal: Negative for back pain, neck pain and neck stiffness.  Skin: Positive for itching and rash.  Neurological: Negative for headaches.   Psychiatric/Behavioral: Negative for agitation.  All other systems reviewed and are negative.    Physical Exam Updated Vital Signs BP (!) 150/83 (BP Location: Left Arm)   Pulse 70   Temp 98.9 F (37.2 C) (Oral)   Resp 20   Ht 5\' 8"  (1.727 m)   Wt 72.6 kg   SpO2 100%   BMI 24.33 kg/m   Physical Exam Vitals signs and nursing note reviewed.  Constitutional:      General: He is not in acute distress.    Appearance: He is well-developed. He is not ill-appearing, toxic-appearing or diaphoretic.  HENT:     Head: Normocephalic and atraumatic.     Nose: Nose normal. No congestion or rhinorrhea.     Mouth/Throat:     Mouth: Mucous membranes are moist.     Pharynx: Oropharynx is clear. No oropharyngeal exudate or posterior oropharyngeal erythema.  Eyes:     Extraocular Movements: Extraocular movements intact.     Conjunctiva/sclera: Conjunctivae normal.     Pupils: Pupils are equal, round, and reactive to light.  Neck:     Musculoskeletal: Neck supple.  Cardiovascular:     Rate and Rhythm: Normal rate and regular rhythm.     Heart sounds: No murmur.  Pulmonary:     Effort: Pulmonary effort is normal. No respiratory distress.     Breath sounds: Normal breath sounds. No wheezing, rhonchi or rales.  Abdominal:     General: There is no distension.     Palpations: Abdomen is soft.     Tenderness: There is no abdominal tenderness.  Musculoskeletal:        General: Swelling present. No tenderness.     Right lower leg: No edema.     Left lower leg: No edema.  Skin:    General: Skin is warm and dry.     Coloration: Skin is not pale.     Findings: Erythema and rash present.  Neurological:     General: No focal deficit present.     Mental Status: He is alert and oriented to person, place, and time.     Sensory: No sensory deficit.     Motor: No weakness.  Psychiatric:        Mood and Affect: Mood normal.      ED Treatments / Results  Labs (all labs ordered are listed, but  only abnormal results are displayed) Labs Reviewed - No data to display  EKG None  Radiology No results found.  Procedures Procedures (including critical care time)  Medications Ordered in ED Medications  methylPREDNISolone sodium succinate (SOLU-MEDROL) 125 mg/2 mL injection 125 mg (125 mg Intravenous Given 10/05/18 1543)  famotidine (PEPCID) IVPB 20 mg premix (0 mg Intravenous Stopped 10/05/18 1613)     Initial Impression / Assessment and Plan / ED Course  I have reviewed  the triage vital signs and the nursing notes.  Pertinent labs & imaging results that were available during my care of the patient were reviewed by me and considered in my medical decision making (see chart for details).        Andrew Suarez is a 63 y.o. male with a past medical history significant for hypertension and GERD who presents with a bee sting with allergic reaction.  Patient reports at 30 years ago he had a bee sting causing him to have an anaphylactic reaction.  He reports he was given a prescription for an EpiPen but it has since expired.  He reports that today he was trimming bushes when he was stung on his left hand by a wasp or hornet.  He says that approximate for 5 minutes he developed swelling of the hand then diffuse urticarial rash including in his groin and his chest.  He reports he thought he was having some weird feeling in his neck but did not have difficulty breathing or wheezing.  He denied nausea or vomiting.  Patient had diffuse itching and decided to take Benadryl.  He took Benadryl at home and started feeling better.  He then came to the emergency department for further evaluation.  On exam, patient still has a faint rash that is improving.  Patient's left hand is somewhat swollen but has good grip strength sensation and pulses.  No evidence of compartment syndrome.  Lungs are clear with no wheezing.  Abdomen and chest are nontender.  Patient is not tachycardic.  Oropharyngeal exam is  unremarkable with no evidence of swelling in his tongue or lips.  Patient has no stridor on exam.  No difficulty swallowing or breathing.  Patient resting comfortably.  Had a shared decision making conversation with patient and agreed to provide Pepcid for the other antihistamine as well as steroids.  At this point, given lack of airway compromise, will hold on epinephrine.  Patient will be watched for period of time and likely given a burst of steroids at discharge as well as prescription for EpiPen.  Anticipate discharge if patient continues to improve.       6:12 PM Patient has now been here for several hours and has not had any worsening of symptoms.  His itching and rash has improved.  Patient continues to have no respiratory compromise or swelling in his mouth or airway.  Do not feel patient needs epinephrine at this time however we will provide a prescription for EpiPen.  We will also give prescription for steroids.  Patient instructed to take antihistamines at home and follow-up with PCP for possible allergy testing.  Patient understood return precautions and agreed with plan of care.  Patient was discharged with family for further outpatient management.   Final Clinical Impressions(s) / ED Diagnoses   Final diagnoses:  Allergic reaction, initial encounter  Bee sting, accidental or unintentional, initial encounter  Anaphylaxis, initial encounter    ED Discharge Orders         Ordered    predniSONE (DELTASONE) 50 MG tablet  Daily     10/05/18 1815    EPINEPHrine 0.3 mg/0.3 mL IJ SOAJ injection  As needed     10/05/18 1815         Clinical Impression: 1. Allergic reaction, initial encounter   2. Bee sting, accidental or unintentional, initial encounter   3. Anaphylaxis, initial encounter     Disposition: Discharge  Condition: Good  I have discussed the results, Dx and  Tx plan with the pt(& family if present). He/she/they expressed understanding and agree(s) with the plan.  Discharge instructions discussed at great length. Strict return precautions discussed and pt &/or family have verbalized understanding of the instructions. No further questions at time of discharge.    New Prescriptions   EPINEPHRINE 0.3 MG/0.3 ML IJ SOAJ INJECTION    Inject 0.3 mLs (0.3 mg total) into the muscle as needed for anaphylaxis.   PREDNISONE (DELTASONE) 50 MG TABLET    Take 1 tablet (50 mg total) by mouth daily for 4 days.    Follow Up: Maurice Small, MD [address] [place] [address] [place] [address] [phone]     [facility] [address] [id number] [facility] [place] [phone]       Jastin Fore, Gwenyth Allegra, MD 10/05/18 1816

## 2018-10-05 NOTE — Discharge Instructions (Signed)
Your history and exam today are consistent with anaphylactic reaction to the bee sting you sustained today.  As you initiated treatment at home with the Benadryl, your symptoms appeared to improve.  It did not progress to airway compromise which is what we monitored you for.  With the other antihistamines and steroids, your symptoms have continued to improve.  Please fill the prescription for the EpiPen and the steroids.  Please start the steroid tomorrow for the next 4 days and you may use over-the-counter Benadryl for further management.  Please follow-up with your PCP for allergy testing.  If any symptoms change or worsen, please return to the nearest emergency department.

## 2018-10-05 NOTE — ED Notes (Signed)
Pt c/o bee sting to left hand apptox 30 mins pta. Pt reported sob upon arrival, denies at this time. Pt also reports groin redness. Reported itching to neck, arms and groin area that pt states has resolved. Took benadryl pta.

## 2018-10-05 NOTE — ED Notes (Signed)
Provider at the bedside.  

## 2018-10-10 DIAGNOSIS — M5416 Radiculopathy, lumbar region: Secondary | ICD-10-CM | POA: Diagnosis not present

## 2018-11-06 DIAGNOSIS — Z6825 Body mass index (BMI) 25.0-25.9, adult: Secondary | ICD-10-CM | POA: Diagnosis not present

## 2018-11-06 DIAGNOSIS — M47816 Spondylosis without myelopathy or radiculopathy, lumbar region: Secondary | ICD-10-CM | POA: Diagnosis not present

## 2018-11-06 DIAGNOSIS — M961 Postlaminectomy syndrome, not elsewhere classified: Secondary | ICD-10-CM | POA: Diagnosis not present

## 2018-11-06 DIAGNOSIS — I10 Essential (primary) hypertension: Secondary | ICD-10-CM | POA: Diagnosis not present

## 2018-12-12 DIAGNOSIS — M542 Cervicalgia: Secondary | ICD-10-CM | POA: Diagnosis not present

## 2018-12-19 DIAGNOSIS — M542 Cervicalgia: Secondary | ICD-10-CM | POA: Diagnosis not present

## 2018-12-19 DIAGNOSIS — M47816 Spondylosis without myelopathy or radiculopathy, lumbar region: Secondary | ICD-10-CM | POA: Diagnosis not present

## 2018-12-26 DIAGNOSIS — Z6825 Body mass index (BMI) 25.0-25.9, adult: Secondary | ICD-10-CM | POA: Diagnosis not present

## 2018-12-26 DIAGNOSIS — M5412 Radiculopathy, cervical region: Secondary | ICD-10-CM | POA: Diagnosis not present

## 2018-12-26 DIAGNOSIS — I10 Essential (primary) hypertension: Secondary | ICD-10-CM | POA: Diagnosis not present

## 2019-01-09 DIAGNOSIS — Z20828 Contact with and (suspected) exposure to other viral communicable diseases: Secondary | ICD-10-CM | POA: Diagnosis not present

## 2019-01-23 DIAGNOSIS — Z20828 Contact with and (suspected) exposure to other viral communicable diseases: Secondary | ICD-10-CM | POA: Diagnosis not present

## 2019-01-30 DIAGNOSIS — M4802 Spinal stenosis, cervical region: Secondary | ICD-10-CM | POA: Diagnosis not present

## 2019-01-30 DIAGNOSIS — M79601 Pain in right arm: Secondary | ICD-10-CM | POA: Diagnosis not present

## 2019-01-30 DIAGNOSIS — R200 Anesthesia of skin: Secondary | ICD-10-CM | POA: Diagnosis not present

## 2019-03-20 ENCOUNTER — Ambulatory Visit: Payer: Medicare Other

## 2019-03-20 DIAGNOSIS — M5412 Radiculopathy, cervical region: Secondary | ICD-10-CM | POA: Diagnosis not present

## 2019-04-02 DIAGNOSIS — Z23 Encounter for immunization: Secondary | ICD-10-CM | POA: Diagnosis not present

## 2019-04-10 ENCOUNTER — Ambulatory Visit: Payer: Medicare Other

## 2019-04-17 DIAGNOSIS — M79601 Pain in right arm: Secondary | ICD-10-CM | POA: Diagnosis not present

## 2019-04-24 DIAGNOSIS — Z23 Encounter for immunization: Secondary | ICD-10-CM | POA: Diagnosis not present

## 2019-04-28 DIAGNOSIS — Z1152 Encounter for screening for COVID-19: Secondary | ICD-10-CM | POA: Diagnosis not present

## 2019-05-02 DIAGNOSIS — M79601 Pain in right arm: Secondary | ICD-10-CM | POA: Diagnosis not present

## 2019-05-02 DIAGNOSIS — M4804 Spinal stenosis, thoracic region: Secondary | ICD-10-CM | POA: Diagnosis not present

## 2019-05-02 DIAGNOSIS — M7138 Other bursal cyst, other site: Secondary | ICD-10-CM | POA: Diagnosis not present

## 2019-05-02 DIAGNOSIS — M2578 Osteophyte, vertebrae: Secondary | ICD-10-CM | POA: Diagnosis not present

## 2019-05-02 DIAGNOSIS — M5414 Radiculopathy, thoracic region: Secondary | ICD-10-CM | POA: Diagnosis not present

## 2019-05-21 DIAGNOSIS — R7303 Prediabetes: Secondary | ICD-10-CM | POA: Diagnosis not present

## 2019-05-21 DIAGNOSIS — Z0000 Encounter for general adult medical examination without abnormal findings: Secondary | ICD-10-CM | POA: Diagnosis not present

## 2019-05-21 DIAGNOSIS — I10 Essential (primary) hypertension: Secondary | ICD-10-CM | POA: Diagnosis not present

## 2019-05-21 DIAGNOSIS — E785 Hyperlipidemia, unspecified: Secondary | ICD-10-CM | POA: Diagnosis not present

## 2019-05-21 DIAGNOSIS — Z125 Encounter for screening for malignant neoplasm of prostate: Secondary | ICD-10-CM | POA: Diagnosis not present

## 2019-06-19 DIAGNOSIS — M47816 Spondylosis without myelopathy or radiculopathy, lumbar region: Secondary | ICD-10-CM | POA: Diagnosis not present

## 2019-07-03 DIAGNOSIS — H2513 Age-related nuclear cataract, bilateral: Secondary | ICD-10-CM | POA: Diagnosis not present

## 2019-08-07 DIAGNOSIS — M47816 Spondylosis without myelopathy or radiculopathy, lumbar region: Secondary | ICD-10-CM | POA: Diagnosis not present

## 2019-08-14 DIAGNOSIS — H53483 Generalized contraction of visual field, bilateral: Secondary | ICD-10-CM | POA: Diagnosis not present

## 2019-09-04 DIAGNOSIS — M47816 Spondylosis without myelopathy or radiculopathy, lumbar region: Secondary | ICD-10-CM | POA: Diagnosis not present

## 2019-09-15 DIAGNOSIS — M47816 Spondylosis without myelopathy or radiculopathy, lumbar region: Secondary | ICD-10-CM | POA: Diagnosis not present

## 2019-11-06 DIAGNOSIS — Z6825 Body mass index (BMI) 25.0-25.9, adult: Secondary | ICD-10-CM | POA: Diagnosis not present

## 2019-11-06 DIAGNOSIS — I10 Essential (primary) hypertension: Secondary | ICD-10-CM | POA: Diagnosis not present

## 2019-11-06 DIAGNOSIS — Z981 Arthrodesis status: Secondary | ICD-10-CM | POA: Diagnosis not present

## 2019-11-13 DIAGNOSIS — M5416 Radiculopathy, lumbar region: Secondary | ICD-10-CM | POA: Diagnosis not present

## 2019-11-16 DIAGNOSIS — Z23 Encounter for immunization: Secondary | ICD-10-CM | POA: Diagnosis not present

## 2019-11-19 DIAGNOSIS — M5416 Radiculopathy, lumbar region: Secondary | ICD-10-CM | POA: Diagnosis not present

## 2019-11-19 DIAGNOSIS — Z981 Arthrodesis status: Secondary | ICD-10-CM | POA: Diagnosis not present

## 2019-11-19 DIAGNOSIS — M961 Postlaminectomy syndrome, not elsewhere classified: Secondary | ICD-10-CM | POA: Diagnosis not present

## 2019-11-19 DIAGNOSIS — M47816 Spondylosis without myelopathy or radiculopathy, lumbar region: Secondary | ICD-10-CM | POA: Diagnosis not present

## 2019-12-10 DIAGNOSIS — Z981 Arthrodesis status: Secondary | ICD-10-CM | POA: Diagnosis not present

## 2019-12-10 DIAGNOSIS — G894 Chronic pain syndrome: Secondary | ICD-10-CM | POA: Diagnosis not present

## 2019-12-10 DIAGNOSIS — M5416 Radiculopathy, lumbar region: Secondary | ICD-10-CM | POA: Diagnosis not present

## 2019-12-24 DIAGNOSIS — G894 Chronic pain syndrome: Secondary | ICD-10-CM | POA: Diagnosis not present

## 2020-01-08 DIAGNOSIS — M961 Postlaminectomy syndrome, not elsewhere classified: Secondary | ICD-10-CM | POA: Diagnosis not present

## 2020-01-08 DIAGNOSIS — Z981 Arthrodesis status: Secondary | ICD-10-CM | POA: Diagnosis not present

## 2020-01-08 DIAGNOSIS — M5416 Radiculopathy, lumbar region: Secondary | ICD-10-CM | POA: Diagnosis not present

## 2020-01-28 DIAGNOSIS — Z9889 Other specified postprocedural states: Secondary | ICD-10-CM | POA: Diagnosis not present

## 2020-01-28 DIAGNOSIS — G894 Chronic pain syndrome: Secondary | ICD-10-CM | POA: Diagnosis not present

## 2020-01-28 DIAGNOSIS — Z981 Arthrodesis status: Secondary | ICD-10-CM | POA: Diagnosis not present

## 2020-01-28 DIAGNOSIS — M961 Postlaminectomy syndrome, not elsewhere classified: Secondary | ICD-10-CM | POA: Diagnosis not present

## 2020-02-19 DIAGNOSIS — M4314 Spondylolisthesis, thoracic region: Secondary | ICD-10-CM | POA: Diagnosis not present

## 2020-02-19 DIAGNOSIS — Z981 Arthrodesis status: Secondary | ICD-10-CM | POA: Diagnosis not present

## 2020-02-19 DIAGNOSIS — M5124 Other intervertebral disc displacement, thoracic region: Secondary | ICD-10-CM | POA: Diagnosis not present

## 2020-03-03 DIAGNOSIS — M47814 Spondylosis without myelopathy or radiculopathy, thoracic region: Secondary | ICD-10-CM | POA: Diagnosis not present

## 2020-03-03 DIAGNOSIS — M961 Postlaminectomy syndrome, not elsewhere classified: Secondary | ICD-10-CM | POA: Diagnosis not present

## 2020-05-12 ENCOUNTER — Emergency Department (HOSPITAL_COMMUNITY): Payer: No Typology Code available for payment source

## 2020-05-12 ENCOUNTER — Inpatient Hospital Stay (HOSPITAL_COMMUNITY)
Admission: EM | Admit: 2020-05-12 | Discharge: 2020-05-21 | DRG: 963 | Disposition: E | Payer: No Typology Code available for payment source | Attending: Surgery | Admitting: Surgery

## 2020-05-12 ENCOUNTER — Encounter (HOSPITAL_COMMUNITY): Payer: Self-pay

## 2020-05-12 ENCOUNTER — Inpatient Hospital Stay (HOSPITAL_COMMUNITY): Payer: No Typology Code available for payment source

## 2020-05-12 DIAGNOSIS — R55 Syncope and collapse: Secondary | ICD-10-CM | POA: Diagnosis not present

## 2020-05-12 DIAGNOSIS — J939 Pneumothorax, unspecified: Secondary | ICD-10-CM

## 2020-05-12 DIAGNOSIS — T07XXXA Unspecified multiple injuries, initial encounter: Principal | ICD-10-CM

## 2020-05-12 DIAGNOSIS — S129XXA Fracture of neck, unspecified, initial encounter: Secondary | ICD-10-CM | POA: Diagnosis present

## 2020-05-12 DIAGNOSIS — T1490XA Injury, unspecified, initial encounter: Secondary | ICD-10-CM

## 2020-05-12 DIAGNOSIS — Z79899 Other long term (current) drug therapy: Secondary | ICD-10-CM | POA: Diagnosis not present

## 2020-05-12 DIAGNOSIS — S3993XA Unspecified injury of pelvis, initial encounter: Secondary | ICD-10-CM | POA: Diagnosis not present

## 2020-05-12 DIAGNOSIS — Z515 Encounter for palliative care: Secondary | ICD-10-CM

## 2020-05-12 DIAGNOSIS — Z9911 Dependence on respirator [ventilator] status: Secondary | ICD-10-CM | POA: Diagnosis not present

## 2020-05-12 DIAGNOSIS — S0990XA Unspecified injury of head, initial encounter: Secondary | ICD-10-CM | POA: Diagnosis not present

## 2020-05-12 DIAGNOSIS — R4182 Altered mental status, unspecified: Secondary | ICD-10-CM | POA: Diagnosis not present

## 2020-05-12 DIAGNOSIS — Z4682 Encounter for fitting and adjustment of non-vascular catheter: Secondary | ICD-10-CM | POA: Diagnosis not present

## 2020-05-12 DIAGNOSIS — R579 Shock, unspecified: Secondary | ICD-10-CM | POA: Diagnosis not present

## 2020-05-12 DIAGNOSIS — R000 Tachycardia, unspecified: Secondary | ICD-10-CM | POA: Diagnosis not present

## 2020-05-12 DIAGNOSIS — K219 Gastro-esophageal reflux disease without esophagitis: Secondary | ICD-10-CM | POA: Diagnosis present

## 2020-05-12 DIAGNOSIS — Y92410 Unspecified street and highway as the place of occurrence of the external cause: Secondary | ICD-10-CM | POA: Diagnosis not present

## 2020-05-12 DIAGNOSIS — I472 Ventricular tachycardia: Secondary | ICD-10-CM | POA: Diagnosis present

## 2020-05-12 DIAGNOSIS — G253 Myoclonus: Secondary | ICD-10-CM | POA: Diagnosis present

## 2020-05-12 DIAGNOSIS — Z20822 Contact with and (suspected) exposure to covid-19: Secondary | ICD-10-CM | POA: Diagnosis present

## 2020-05-12 DIAGNOSIS — S06369A Traumatic hemorrhage of cerebrum, unspecified, with loss of consciousness of unspecified duration, initial encounter: Secondary | ICD-10-CM | POA: Diagnosis present

## 2020-05-12 DIAGNOSIS — S270XXA Traumatic pneumothorax, initial encounter: Secondary | ICD-10-CM | POA: Diagnosis present

## 2020-05-12 DIAGNOSIS — R464 Slowness and poor responsiveness: Secondary | ICD-10-CM | POA: Diagnosis not present

## 2020-05-12 DIAGNOSIS — M2578 Osteophyte, vertebrae: Secondary | ICD-10-CM | POA: Diagnosis not present

## 2020-05-12 DIAGNOSIS — I639 Cerebral infarction, unspecified: Secondary | ICD-10-CM | POA: Diagnosis not present

## 2020-05-12 DIAGNOSIS — N179 Acute kidney failure, unspecified: Secondary | ICD-10-CM | POA: Diagnosis present

## 2020-05-12 DIAGNOSIS — M4312 Spondylolisthesis, cervical region: Secondary | ICD-10-CM | POA: Diagnosis not present

## 2020-05-12 DIAGNOSIS — M1990 Unspecified osteoarthritis, unspecified site: Secondary | ICD-10-CM | POA: Diagnosis present

## 2020-05-12 DIAGNOSIS — Z981 Arthrodesis status: Secondary | ICD-10-CM | POA: Diagnosis not present

## 2020-05-12 DIAGNOSIS — I615 Nontraumatic intracerebral hemorrhage, intraventricular: Secondary | ICD-10-CM | POA: Diagnosis not present

## 2020-05-12 DIAGNOSIS — S199XXA Unspecified injury of neck, initial encounter: Secondary | ICD-10-CM | POA: Diagnosis not present

## 2020-05-12 DIAGNOSIS — I10 Essential (primary) hypertension: Secondary | ICD-10-CM | POA: Diagnosis present

## 2020-05-12 DIAGNOSIS — R404 Transient alteration of awareness: Secondary | ICD-10-CM | POA: Diagnosis present

## 2020-05-12 DIAGNOSIS — Z66 Do not resuscitate: Secondary | ICD-10-CM | POA: Diagnosis present

## 2020-05-12 DIAGNOSIS — S134XXA Sprain of ligaments of cervical spine, initial encounter: Secondary | ICD-10-CM | POA: Diagnosis not present

## 2020-05-12 DIAGNOSIS — I4510 Unspecified right bundle-branch block: Secondary | ICD-10-CM | POA: Diagnosis present

## 2020-05-12 DIAGNOSIS — S2243XA Multiple fractures of ribs, bilateral, initial encounter for closed fracture: Secondary | ICD-10-CM | POA: Diagnosis present

## 2020-05-12 DIAGNOSIS — M47816 Spondylosis without myelopathy or radiculopathy, lumbar region: Secondary | ICD-10-CM | POA: Diagnosis not present

## 2020-05-12 DIAGNOSIS — I4891 Unspecified atrial fibrillation: Secondary | ICD-10-CM | POA: Diagnosis present

## 2020-05-12 DIAGNOSIS — M5416 Radiculopathy, lumbar region: Secondary | ICD-10-CM | POA: Diagnosis present

## 2020-05-12 DIAGNOSIS — M160 Bilateral primary osteoarthritis of hip: Secondary | ICD-10-CM | POA: Diagnosis not present

## 2020-05-12 DIAGNOSIS — R4020 Unspecified coma: Secondary | ICD-10-CM | POA: Diagnosis not present

## 2020-05-12 DIAGNOSIS — J9601 Acute respiratory failure with hypoxia: Secondary | ICD-10-CM | POA: Diagnosis present

## 2020-05-12 DIAGNOSIS — R7989 Other specified abnormal findings of blood chemistry: Secondary | ICD-10-CM | POA: Diagnosis present

## 2020-05-12 DIAGNOSIS — R739 Hyperglycemia, unspecified: Secondary | ICD-10-CM | POA: Diagnosis present

## 2020-05-12 DIAGNOSIS — G8929 Other chronic pain: Secondary | ICD-10-CM | POA: Diagnosis present

## 2020-05-12 DIAGNOSIS — I469 Cardiac arrest, cause unspecified: Secondary | ICD-10-CM | POA: Diagnosis not present

## 2020-05-12 DIAGNOSIS — R570 Cardiogenic shock: Secondary | ICD-10-CM | POA: Diagnosis present

## 2020-05-12 DIAGNOSIS — J439 Emphysema, unspecified: Secondary | ICD-10-CM | POA: Diagnosis not present

## 2020-05-12 DIAGNOSIS — G931 Anoxic brain damage, not elsewhere classified: Secondary | ICD-10-CM | POA: Diagnosis present

## 2020-05-12 DIAGNOSIS — S06309A Unspecified focal traumatic brain injury with loss of consciousness of unspecified duration, initial encounter: Secondary | ICD-10-CM | POA: Diagnosis present

## 2020-05-12 DIAGNOSIS — R001 Bradycardia, unspecified: Secondary | ICD-10-CM | POA: Diagnosis present

## 2020-05-12 DIAGNOSIS — Z91030 Bee allergy status: Secondary | ICD-10-CM | POA: Diagnosis not present

## 2020-05-12 DIAGNOSIS — S13120A Subluxation of C1/C2 cervical vertebrae, initial encounter: Secondary | ICD-10-CM | POA: Diagnosis not present

## 2020-05-12 DIAGNOSIS — G319 Degenerative disease of nervous system, unspecified: Secondary | ICD-10-CM | POA: Diagnosis not present

## 2020-05-12 DIAGNOSIS — I517 Cardiomegaly: Secondary | ICD-10-CM | POA: Diagnosis not present

## 2020-05-12 DIAGNOSIS — E785 Hyperlipidemia, unspecified: Secondary | ICD-10-CM | POA: Diagnosis not present

## 2020-05-12 DIAGNOSIS — D171 Benign lipomatous neoplasm of skin and subcutaneous tissue of trunk: Secondary | ICD-10-CM | POA: Diagnosis not present

## 2020-05-12 DIAGNOSIS — R0689 Other abnormalities of breathing: Secondary | ICD-10-CM | POA: Diagnosis not present

## 2020-05-12 DIAGNOSIS — M4802 Spinal stenosis, cervical region: Secondary | ICD-10-CM | POA: Diagnosis not present

## 2020-05-12 DIAGNOSIS — J9382 Other air leak: Secondary | ICD-10-CM | POA: Diagnosis not present

## 2020-05-12 DIAGNOSIS — J9600 Acute respiratory failure, unspecified whether with hypoxia or hypercapnia: Secondary | ICD-10-CM | POA: Diagnosis not present

## 2020-05-12 DIAGNOSIS — G8220 Paraplegia, unspecified: Secondary | ICD-10-CM | POA: Diagnosis present

## 2020-05-12 DIAGNOSIS — D1779 Benign lipomatous neoplasm of other sites: Secondary | ICD-10-CM | POA: Diagnosis not present

## 2020-05-12 DIAGNOSIS — I499 Cardiac arrhythmia, unspecified: Secondary | ICD-10-CM | POA: Diagnosis not present

## 2020-05-12 DIAGNOSIS — M4319 Spondylolisthesis, multiple sites in spine: Secondary | ICD-10-CM | POA: Diagnosis not present

## 2020-05-12 DIAGNOSIS — J019 Acute sinusitis, unspecified: Secondary | ICD-10-CM | POA: Diagnosis not present

## 2020-05-12 DIAGNOSIS — J984 Other disorders of lung: Secondary | ICD-10-CM | POA: Diagnosis not present

## 2020-05-12 DIAGNOSIS — S2691XA Contusion of heart, unspecified with or without hemopericardium, initial encounter: Secondary | ICD-10-CM | POA: Diagnosis present

## 2020-05-12 DIAGNOSIS — T447X5A Adverse effect of beta-adrenoreceptor antagonists, initial encounter: Secondary | ICD-10-CM | POA: Diagnosis present

## 2020-05-12 DIAGNOSIS — S3991XA Unspecified injury of abdomen, initial encounter: Secondary | ICD-10-CM | POA: Diagnosis not present

## 2020-05-12 LAB — ECHOCARDIOGRAM COMPLETE
AR max vel: 2.24 cm2
AV Area VTI: 1.16 cm2
AV Area mean vel: 1.49 cm2
AV Mean grad: 1 mmHg
AV Peak grad: 1.4 mmHg
Ao pk vel: 0.59 m/s
Area-P 1/2: 1.83 cm2
Calc EF: 47.4 %
Height: 68 in
S' Lateral: 2.9 cm
Single Plane A2C EF: 42.6 %
Single Plane A4C EF: 52.4 %

## 2020-05-12 LAB — COMPREHENSIVE METABOLIC PANEL
ALT: 145 U/L — ABNORMAL HIGH (ref 0–44)
AST: 214 U/L — ABNORMAL HIGH (ref 15–41)
Albumin: 2.9 g/dL — ABNORMAL LOW (ref 3.5–5.0)
Alkaline Phosphatase: 69 U/L (ref 38–126)
Anion gap: 8 (ref 5–15)
BUN: 22 mg/dL (ref 8–23)
CO2: 19 mmol/L — ABNORMAL LOW (ref 22–32)
Calcium: 7.8 mg/dL — ABNORMAL LOW (ref 8.9–10.3)
Chloride: 106 mmol/L (ref 98–111)
Creatinine, Ser: 1.64 mg/dL — ABNORMAL HIGH (ref 0.61–1.24)
GFR, Estimated: 46 mL/min — ABNORMAL LOW (ref >60)
Glucose, Bld: 251 mg/dL — ABNORMAL HIGH (ref 70–99)
Potassium: 4.6 mmol/L (ref 3.5–5.1)
Sodium: 133 mmol/L — ABNORMAL LOW (ref 135–145)
Total Bilirubin: 0.8 mg/dL (ref 0.3–1.2)
Total Protein: 5 g/dL — ABNORMAL LOW (ref 6.5–8.1)

## 2020-05-12 LAB — POCT I-STAT 7, (LYTES, BLD GAS, ICA,H+H)
Acid-base deficit: 7 mmol/L — ABNORMAL HIGH (ref 0.0–2.0)
Bicarbonate: 19.2 mmol/L — ABNORMAL LOW (ref 20.0–28.0)
Calcium, Ion: 1.11 mmol/L — ABNORMAL LOW (ref 1.15–1.40)
HCT: 41 % (ref 39.0–52.0)
Hemoglobin: 13.9 g/dL (ref 13.0–17.0)
O2 Saturation: 98 %
Patient temperature: 98.6
Potassium: 6 mmol/L — ABNORMAL HIGH (ref 3.5–5.1)
Sodium: 132 mmol/L — ABNORMAL LOW (ref 135–145)
TCO2: 20 mmol/L — ABNORMAL LOW (ref 22–32)
pCO2 arterial: 40.5 mmHg (ref 32.0–48.0)
pH, Arterial: 7.284 — ABNORMAL LOW (ref 7.350–7.450)
pO2, Arterial: 124 mmHg — ABNORMAL HIGH (ref 83.0–108.0)

## 2020-05-12 LAB — I-STAT ARTERIAL BLOOD GAS, ED
Acid-base deficit: 8 mmol/L — ABNORMAL HIGH (ref 0.0–2.0)
Bicarbonate: 20.8 mmol/L (ref 20.0–28.0)
Calcium, Ion: 1.22 mmol/L (ref 1.15–1.40)
HCT: 37 % — ABNORMAL LOW (ref 39.0–52.0)
Hemoglobin: 12.6 g/dL — ABNORMAL LOW (ref 13.0–17.0)
O2 Saturation: 80 %
Potassium: 4.9 mmol/L (ref 3.5–5.1)
Sodium: 134 mmol/L — ABNORMAL LOW (ref 135–145)
TCO2: 22 mmol/L (ref 22–32)
pCO2 arterial: 54.9 mmHg — ABNORMAL HIGH (ref 32.0–48.0)
pH, Arterial: 7.187 — CL (ref 7.350–7.450)
pO2, Arterial: 55 mmHg — ABNORMAL LOW (ref 83.0–108.0)

## 2020-05-12 LAB — AMMONIA: Ammonia: 33 umol/L (ref 9–35)

## 2020-05-12 LAB — ETHANOL: Alcohol, Ethyl (B): <10 mg/dL (ref <10)

## 2020-05-12 LAB — RAPID URINE DRUG SCREEN, HOSP PERFORMED
Amphetamines: NOT DETECTED
Barbiturates: NOT DETECTED
Benzodiazepines: NOT DETECTED
Cocaine: NOT DETECTED
Opiates: NOT DETECTED
Tetrahydrocannabinol: NOT DETECTED

## 2020-05-12 LAB — URINALYSIS, ROUTINE W REFLEX MICROSCOPIC
Bacteria, UA: NONE SEEN
Bilirubin Urine: NEGATIVE
Glucose, UA: >=500 mg/dL — AB
Ketones, ur: NEGATIVE mg/dL
Leukocytes,Ua: NEGATIVE
Nitrite: NEGATIVE
Protein, ur: 30 mg/dL — AB
Specific Gravity, Urine: 1.045 — ABNORMAL HIGH (ref 1.005–1.030)
pH: 5 (ref 5.0–8.0)

## 2020-05-12 LAB — I-STAT CHEM 8, ED
BUN: 25 mg/dL — ABNORMAL HIGH (ref 8–23)
Calcium, Ion: 1.11 mmol/L — ABNORMAL LOW (ref 1.15–1.40)
Chloride: 103 mmol/L (ref 98–111)
Creatinine, Ser: 1.5 mg/dL — ABNORMAL HIGH (ref 0.61–1.24)
Glucose, Bld: 245 mg/dL — ABNORMAL HIGH (ref 70–99)
HCT: 37 % — ABNORMAL LOW (ref 39.0–52.0)
Hemoglobin: 12.6 g/dL — ABNORMAL LOW (ref 13.0–17.0)
Potassium: 4.7 mmol/L (ref 3.5–5.1)
Sodium: 134 mmol/L — ABNORMAL LOW (ref 135–145)
TCO2: 21 mmol/L — ABNORMAL LOW (ref 22–32)

## 2020-05-12 LAB — RESP PANEL BY RT-PCR (FLU A&B, COVID) ARPGX2
Influenza A by PCR: NEGATIVE
Influenza B by PCR: NEGATIVE
SARS Coronavirus 2 by RT PCR: NEGATIVE

## 2020-05-12 LAB — TROPONIN I (HIGH SENSITIVITY)
Troponin I (High Sensitivity): 670 ng/L (ref <18)
Troponin I (High Sensitivity): 947 ng/L (ref <18)

## 2020-05-12 LAB — MAGNESIUM: Magnesium: 2 mg/dL (ref 1.7–2.4)

## 2020-05-12 LAB — CBC
HCT: 38.9 % — ABNORMAL LOW (ref 39.0–52.0)
Hemoglobin: 12.7 g/dL — ABNORMAL LOW (ref 13.0–17.0)
MCH: 32.1 pg (ref 26.0–34.0)
MCHC: 32.6 g/dL (ref 30.0–36.0)
MCV: 98.2 fL (ref 80.0–100.0)
Platelets: 183 10*3/uL (ref 150–400)
RBC: 3.96 MIL/uL — ABNORMAL LOW (ref 4.22–5.81)
RDW: 14.4 % (ref 11.5–15.5)
WBC: 13.5 10*3/uL — ABNORMAL HIGH (ref 4.0–10.5)
nRBC: 0 % (ref 0.0–0.2)

## 2020-05-12 LAB — PHOSPHORUS: Phosphorus: 7.3 mg/dL — ABNORMAL HIGH (ref 2.5–4.6)

## 2020-05-12 LAB — PROTIME-INR
INR: 1.2 (ref 0.8–1.2)
Prothrombin Time: 15 seconds (ref 11.4–15.2)

## 2020-05-12 LAB — TRAUMA TEG PANEL
CFF Max Amplitude: 16.4 mm (ref 15–32)
Citrated Kaolin (R): 4.7 min (ref 4.6–9.1)
Citrated Rapid TEG (MA): 49.8 mm — ABNORMAL LOW (ref 52–70)
Lysis at 30 Minutes: 0 % (ref 0.0–2.6)

## 2020-05-12 LAB — ABO/RH: ABO/RH(D): A POS

## 2020-05-12 LAB — HIV ANTIBODY (ROUTINE TESTING W REFLEX): HIV Screen 4th Generation wRfx: NONREACTIVE

## 2020-05-12 LAB — LACTIC ACID, PLASMA: Lactic Acid, Venous: 3 mmol/L (ref 0.5–1.9)

## 2020-05-12 MED ORDER — POLYETHYLENE GLYCOL 3350 17 G PO PACK: 17.0000 g | PACK | Freq: Every day | ORAL | Status: DC

## 2020-05-12 MED ORDER — VECURONIUM BROMIDE 10 MG IV SOLR
INTRAVENOUS | Status: AC
  Filled 2020-05-12: qty 10

## 2020-05-12 MED ORDER — ORAL CARE MOUTH RINSE
15.0000 mL | OROMUCOSAL | Status: DC
  Administered 2020-05-13 – 2020-05-14 (×13): 15 mL via OROMUCOSAL

## 2020-05-12 MED ORDER — DOCUSATE SODIUM 50 MG/5ML PO LIQD
100.0000 mg | Freq: Two times a day (BID) | ORAL | Status: DC
  Administered 2020-05-13: 100 mg
  Filled 2020-05-12 (×3): qty 10

## 2020-05-12 MED ORDER — NOREPINEPHRINE 4 MG/250ML-% IV SOLN
0.0000 ug/min | INTRAVENOUS | Status: AC
  Administered 2020-05-12: 15 ug/min via INTRAVENOUS
  Administered 2020-05-12: 10 ug/min via INTRAVENOUS
  Filled 2020-05-12 (×2): qty 250

## 2020-05-12 MED ORDER — FENTANYL 2500MCG IN NS 250ML (10MCG/ML) PREMIX INFUSION: 25.0000 ug/h | INTRAVENOUS | Status: DC

## 2020-05-12 MED ORDER — FENTANYL BOLUS VIA INFUSION
25.0000 ug | INTRAVENOUS | Status: DC | PRN
  Filled 2020-05-12: qty 25

## 2020-05-12 MED ORDER — MORPHINE SULFATE (PF) 2 MG/ML IV SOLN: 2.0000 mg | INTRAVENOUS | Status: DC | PRN

## 2020-05-12 MED ORDER — MIDAZOLAM HCL 2 MG/2ML IJ SOLN: 1.0000 mg | INTRAMUSCULAR | Status: DC | PRN

## 2020-05-12 MED ORDER — CHLORHEXIDINE GLUCONATE 0.12% ORAL RINSE (MEDLINE KIT)
15.0000 mL | Freq: Two times a day (BID) | OROMUCOSAL | Status: DC
  Administered 2020-05-13 (×2): 15 mL via OROMUCOSAL

## 2020-05-12 MED ORDER — LEVETIRACETAM IN NACL 500 MG/100ML IV SOLN
500.0000 mg | Freq: Two times a day (BID) | INTRAVENOUS | Status: DC
  Administered 2020-05-12 – 2020-05-13 (×4): 500 mg via INTRAVENOUS
  Filled 2020-05-12 (×3): qty 100

## 2020-05-12 MED ORDER — FENTANYL 2500MCG IN NS 250ML (10MCG/ML) PREMIX INFUSION
25.0000 ug/h | INTRAVENOUS | Status: DC
  Administered 2020-05-12: 100 ug/h via INTRAVENOUS
  Administered 2020-05-13 (×2): 175 ug/h via INTRAVENOUS
  Filled 2020-05-12 (×4): qty 250

## 2020-05-12 MED ORDER — LORAZEPAM 2 MG/ML IJ SOLN
INTRAMUSCULAR | Status: AC
  Administered 2020-05-12: 1 mg
  Filled 2020-05-12: qty 1

## 2020-05-12 MED ORDER — SODIUM CHLORIDE 0.9 % IV SOLN: INTRAVENOUS | Status: DC

## 2020-05-12 MED ORDER — FENTANYL BOLUS VIA INFUSION: 25.0000 ug | INTRAVENOUS | Status: DC | PRN

## 2020-05-12 MED ORDER — ONDANSETRON HCL 4 MG/2ML IJ SOLN: 4.0000 mg | Freq: Four times a day (QID) | INTRAMUSCULAR | Status: DC | PRN

## 2020-05-12 MED ORDER — PROPOFOL 1000 MG/100ML IV EMUL
5.0000 ug/kg/min | INTRAVENOUS | Status: DC
  Administered 2020-05-12: 35 ug/kg/min via INTRAVENOUS
  Administered 2020-05-12: 20 ug/kg/min via INTRAVENOUS
  Administered 2020-05-13: 25 ug/kg/min via INTRAVENOUS
  Administered 2020-05-13: 30 ug/kg/min via INTRAVENOUS
  Administered 2020-05-14: 25 ug/kg/min via INTRAVENOUS
  Filled 2020-05-12 (×7): qty 100

## 2020-05-12 MED ORDER — MIDAZOLAM HCL 2 MG/2ML IJ SOLN
INTRAMUSCULAR | Status: AC
  Filled 2020-05-12: qty 2

## 2020-05-12 MED ORDER — IOHEXOL 300 MG/ML  SOLN
100.0000 mL | Freq: Once | INTRAMUSCULAR | Status: AC | PRN
  Administered 2020-05-12: 100 mL via INTRAVENOUS

## 2020-05-12 MED ORDER — PANTOPRAZOLE SODIUM 40 MG PO TBEC: 40.0000 mg | DELAYED_RELEASE_TABLET | Freq: Every day | ORAL | Status: DC

## 2020-05-12 MED ORDER — CHLORHEXIDINE GLUCONATE CLOTH 2 % EX PADS
6.0000 | MEDICATED_PAD | Freq: Every day | CUTANEOUS | Status: DC
  Administered 2020-05-14: 6 via TOPICAL

## 2020-05-12 MED ORDER — FENTANYL CITRATE (PF) 100 MCG/2ML IJ SOLN: 25.0000 ug | Freq: Once | INTRAMUSCULAR | Status: DC

## 2020-05-12 MED ORDER — MIDAZOLAM HCL 2 MG/2ML IJ SOLN
1.0000 mg | INTRAMUSCULAR | Status: AC | PRN
  Administered 2020-05-12 (×3): 1 mg via INTRAVENOUS
  Filled 2020-05-12 (×2): qty 2

## 2020-05-12 MED ORDER — ONDANSETRON 4 MG PO TBDP: 4.0000 mg | ORAL_TABLET | Freq: Four times a day (QID) | ORAL | Status: DC | PRN

## 2020-05-12 MED ORDER — PROPOFOL 1000 MG/100ML IV EMUL
INTRAVENOUS | Status: AC
  Filled 2020-05-12: qty 100

## 2020-05-12 MED ORDER — HYDROCORTISONE NA SUCCINATE PF 100 MG IJ SOLR
100.0000 mg | Freq: Three times a day (TID) | INTRAMUSCULAR | Status: DC
  Administered 2020-05-12 – 2020-05-14 (×5): 100 mg via INTRAVENOUS
  Filled 2020-05-12 (×5): qty 2

## 2020-05-12 MED ORDER — PANTOPRAZOLE SODIUM 40 MG IV SOLR
40.0000 mg | Freq: Every day | INTRAVENOUS | Status: DC
  Administered 2020-05-12 – 2020-05-13 (×2): 40 mg via INTRAVENOUS
  Filled 2020-05-12 (×2): qty 40

## 2020-05-12 MED ORDER — FENTANYL CITRATE (PF) 100 MCG/2ML IJ SOLN
INTRAMUSCULAR | Status: AC
  Filled 2020-05-12: qty 2

## 2020-05-12 MED ORDER — VASOPRESSIN 20 UNITS/100 ML INFUSION FOR SHOCK
0.0000 [IU]/min | INTRAVENOUS | Status: DC
  Administered 2020-05-12 (×2): 0.03 [IU]/min via INTRAVENOUS
  Administered 2020-05-13: 0.01 [IU]/min via INTRAVENOUS
  Filled 2020-05-12 (×4): qty 100
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
## 2020-05-12 NOTE — Progress Notes (Signed)
Patient has a spinal cord stimulator with no specific info in his chart. Will need MRI safety card w/ model # and manufacturer info for generator and leads to determine if the implant is conditionally safe for scanning, before MRI can be done. RN aware.
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
## 2020-05-12 NOTE — ED Notes (Signed)
bil chest tubes placed 0820 by MD placed to suction
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
## 2020-05-12 NOTE — Progress Notes (Signed)
Patient transported to 4N29 without any apparent complications.

## 2020-05-12 NOTE — TOC CAGE-AID Note (Signed)
Transition of Care [facility]) - CAGE-AID Screening   Patient Details  Name: Andrew Suarez MRN: [id number] Date of Birth: 1955-07-09   Elvina Sidle, RN Trauma Response Nurse  04/26/2020, 11:25 AM      CAGE-AID Screening: Substance Abuse Screening unable to be completed due to: : Patient unable to participate (pt is intubated)
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
## 2020-05-12 NOTE — Procedures (Signed)
Arterial Catheter Insertion Procedure Note  MITHRAN STRIKE  [id number]  07/30/1955  Date:05/03/2020  Time:12:11 PM    Provider Performing: Kathie Dike    Procedure: Insertion of Arterial Line [phone]) without US guidance  Indication(s) Blood pressure monitoring and/or need for frequent ABGs  Consent Risks of the procedure as well as the alternatives and risks of each were explained to the patient and/or caregiver.  Consent for the procedure was obtained and is signed in the bedside chart  Anesthesia None   Time Out Verified patient identification, verified procedure, site/side was marked, verified correct patient position, special equipment/implants available, medications/allergies/relevant history reviewed, required imaging and test results available.   Sterile Technique Maximal sterile technique including full sterile barrier drape, hand hygiene, sterile gown, sterile gloves, mask, hair covering, sterile ultrasound probe cover (if used).   Procedure Description Area of catheter insertion was cleaned with chlorhexidine and draped in sterile fashion. Without real-time ultrasound guidance an arterial catheter was placed into the left radial artery.  Appropriate arterial tracings confirmed on monitor.     Complications/Tolerance None; patient tolerated the procedure well.   EBL none   Specimen(s) None

## 2020-05-12 NOTE — Procedures (Signed)
Procedure Note  Date: 05/07/2020  Procedure: tube thoracostomy--left    Pre-op diagnosis: left pneumothorax  Post-op diagnosis: same  Surgeon: Jesusita Oka, MD   EBL: <5cc procedural Drains/Implants: 109F chest tube Specimen: none  Description of procedure: This procedure was performed emergently and therefore informed consent was not obtained.  A small skin nick was made at the fourth intercostal space. An introducer needle was inserted until air was aspirated and a guidewire inserted through the needle. The needle was removed and the tract dilated. The chest tube was inserted over the guidewire and the guidewire removed.   The tube was secured at the skin with suture and connected to an atrium at -20cm water wall suction. Immediate output from the chest tube was 0cc. The site was dressed with gauze and tape. The patient tolerated the procedure well. There were no complications.     Jesusita Oka, MD General and [facility]     Procedure Note  Date: 04/29/2020  Procedure: tube thoracostomy--right    Pre-op diagnosis: right pneumothorax  Post-op diagnosis: same  Surgeon: Jesusita Oka, MD  EBL: <5cc procedural Drains/Implants: 109F chest tube Specimen: none  Description of procedure: This procedure was performed emergently and therefore informed consent was not obtained.  A small skin nick was made at the fourth intercostal space. An introducer needle was inserted until air was aspirated and a guidewire inserted through the needle. The needle was removed and the tract dilated. The chest tube was inserted over the guidewire and the guidewire removed.   The tube was secured at the skin with suture and connected to an atrium at -20cm water wall suction. Immediate output from the chest tube was 0cc. The site was dressed with gauze and tape. The patient tolerated the procedure well. There were no complications.     Jesusita Oka,  MD General and Trauma Surgery [facility]     Procedure Note  Date: 04/25/2020  Procedure: central venous catheter placement--left, subclavian vein, without ultrasound guidance  Pre-op diagnosis: hypotension, need for administration of vasopressors Post-op diagnosis: same  Surgeon: Jesusita Oka, MD  EBL: <5cc Drains/Implants: 16 cm, triple lumen central venous catheter  Description of procedure: This procedure was performed emergently and therefore informed consent was not obtained. The left upper chest was prepped and draped in the usual sterile fashion. The left subclavian vein was accessed using an introducer needle and a guidewire passed through the needle. The needle was removed and a skin nick was made. The tract was dilated and the central venous catheter advanced over the guidewire followed by removal of the guidewire. All ports drew blood easily and all were flushed with saline. The catheter was secured to the skin with suture and a sterile dressing. The patient tolerated the procedure well. There were no immediate complications. Follow up chest x-ray was ordered to confirm positioning and the absence of a pneumothorax.    Jesusita Oka, MD General and [facility]    Procedure Note  Date: 05/16/2020  Procedure: tube thoracostomy--left    Pre-op diagnosis: left pneumothorax  Post-op diagnosis: same  Surgeon: Jesusita Oka, MD  Indication: On chest xray after bilateral chest tube placement and central line placement, persistent pneumothorax was noted on the left, refractory to increased suction and in the setting of hypotension, the decision was made to place a larger bore chest tube on the left.   EBL: <5cc procedural Drains/Implants: 34F  chest tube Specimen: none  Description of procedure: This procedure was performed emergently and therefore informed consent was not obtained.  A longitudinal incision was made  parallel to the rib at the third intercostal space. This incision was deepened down through the muscle until the pleural cavity was entered. An audible air rush was encountered upon entry and a 42F chest tube was inserted through this tract.   The tube was secured at the skin with suture and connected to an atrium at -20cm water wall suction. Immediate output from the chest tube was 0cc. The site was dressed with gauze and tape. The patient tolerated the procedure well. There were no complications. Follow up chest x-ray was ordered to confirm tube positioning, complete evacuation, and complete lung re-expansion.    Jesusita Oka, MD General and [facility]
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
## 2020-05-12 NOTE — Progress Notes (Signed)
  Echocardiogram 2D Echocardiogram has been performed.  Andrew Suarez 04/23/2020, 11:46 AM

## 2020-05-12 NOTE — ED Notes (Signed)
Third chest tube placed on the left side by MD

## 2020-05-12 NOTE — Progress Notes (Signed)
Patient transported to CT and back to the ED without any apparent airway complications.
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
## 2020-05-12 NOTE — Procedures (Signed)
Insertion of Chest Tube Procedure Note  Andrew Suarez  [id number]  July 22, 1955  Date:05/04/2020  Time:9:54 AM    Provider Performing: Jill Alexanders   Procedure: Chest Tube Insertion [phone])  Indication(s) Pneumothorax  Consent Unable to obtain consent due to emergent nature of procedure.  Anesthesia sedated with fentanyl, mechinical ventilation  Time Out Verified patient identification, verified procedure, site/side was marked, verified correct patient position, special equipment/implants available, medications/allergies/relevant history reviewed, required imaging and test results available.   Sterile Technique Maximal sterile technique was not able to be achieved due to emergent nature of procedure.  Procedure Description Ultrasound not used to identify appropriate pleural anatomy for placement and overlying skin marked. Area of placement cleaned and draped in sterile fashion.  A 14 French pigtail pleural catheter was placed into the right pleural space using Seldinger technique. Appropriate return of air was obtained.  The tube was connected to atrium and placed on -20 cm H2O wall suction.   Complications/Tolerance None; patient tolerated the procedure well. Chest X-ray is ordered to verify placement.   EBL Minimal  Specimen(s) none   Andrew Dredge, PA-C [facility] Please see Amion for pager number during day hours 7:00am-4:30pm

## 2020-05-13 ENCOUNTER — Inpatient Hospital Stay (HOSPITAL_COMMUNITY): Payer: No Typology Code available for payment source

## 2020-05-13 LAB — COMPREHENSIVE METABOLIC PANEL
ALT: 98 U/L — ABNORMAL HIGH (ref 0–44)
AST: 83 U/L — ABNORMAL HIGH (ref 15–41)
Albumin: 2.7 g/dL — ABNORMAL LOW (ref 3.5–5.0)
Alkaline Phosphatase: 48 U/L (ref 38–126)
Anion gap: 8 (ref 5–15)
BUN: 20 mg/dL (ref 8–23)
CO2: 19 mmol/L — ABNORMAL LOW (ref 22–32)
Calcium: 7.8 mg/dL — ABNORMAL LOW (ref 8.9–10.3)
Chloride: 107 mmol/L (ref 98–111)
Creatinine, Ser: 1.17 mg/dL (ref 0.61–1.24)
GFR, Estimated: >60 mL/min (ref >60)
Glucose, Bld: 203 mg/dL — ABNORMAL HIGH (ref 70–99)
Potassium: 4 mmol/L (ref 3.5–5.1)
Sodium: 134 mmol/L — ABNORMAL LOW (ref 135–145)
Total Bilirubin: 0.8 mg/dL (ref 0.3–1.2)
Total Protein: 5.1 g/dL — ABNORMAL LOW (ref 6.5–8.1)

## 2020-05-13 LAB — TRIGLYCERIDES: Triglycerides: 204 mg/dL — ABNORMAL HIGH (ref <150)

## 2020-05-13 LAB — TYPE AND SCREEN
ABO/RH(D): A POS
Antibody Screen: NEGATIVE
Unit division: 0

## 2020-05-13 LAB — CBC
HCT: 32.2 % — ABNORMAL LOW (ref 39.0–52.0)
Hemoglobin: 11.6 g/dL — ABNORMAL LOW (ref 13.0–17.0)
MCH: 33.1 pg (ref 26.0–34.0)
MCHC: 36 g/dL (ref 30.0–36.0)
MCV: 92 fL (ref 80.0–100.0)
Platelets: 135 10*3/uL — ABNORMAL LOW (ref 150–400)
RBC: 3.5 MIL/uL — ABNORMAL LOW (ref 4.22–5.81)
RDW: 15.3 % (ref 11.5–15.5)
WBC: 9.8 10*3/uL (ref 4.0–10.5)
nRBC: 0 % (ref 0.0–0.2)

## 2020-05-13 LAB — BPAM RBC
Blood Product Expiration Date: 202204272359
ISSUE DATE / TIME: 202203230735
Unit Type and Rh: 5100

## 2020-05-13 LAB — TROPONIN I (HIGH SENSITIVITY)
Troponin I (High Sensitivity): 545 ng/L (ref <18)
Troponin I (High Sensitivity): 427 ng/L (ref <18)

## 2020-05-13 LAB — GLUCOSE, CAPILLARY
Glucose-Capillary: 107 mg/dL — ABNORMAL HIGH (ref 70–99)
Glucose-Capillary: 125 mg/dL — ABNORMAL HIGH (ref 70–99)
Glucose-Capillary: 163 mg/dL — ABNORMAL HIGH (ref 70–99)

## 2020-05-13 LAB — BLOOD PRODUCT ORDER (VERBAL) VERIFICATION

## 2020-05-13 LAB — MRSA PCR SCREENING: MRSA by PCR: NEGATIVE

## 2020-05-13 MED ORDER — PROSOURCE TF PO LIQD: 45.0000 mL | Freq: Two times a day (BID) | ORAL | Status: DC

## 2020-05-13 MED ORDER — PIVOT 1.5 CAL PO LIQD
1000.0000 mL | ORAL | Status: DC
  Administered 2020-05-13: 1000 mL
  Filled 2020-05-13: qty 1000

## 2020-05-13 MED ORDER — VITAL HIGH PROTEIN PO LIQD: 1000.0000 mL | ORAL | Status: DC
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
## 2020-05-13 NOTE — Progress Notes (Signed)
[place] refferral made 05/13/2020-038. Spoke with April Shore.
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
## 2020-05-14 DIAGNOSIS — Z515 Encounter for palliative care: Secondary | ICD-10-CM | POA: Diagnosis not present

## 2020-05-14 DIAGNOSIS — T07XXXA Unspecified multiple injuries, initial encounter: Secondary | ICD-10-CM

## 2020-05-14 DIAGNOSIS — J939 Pneumothorax, unspecified: Secondary | ICD-10-CM

## 2020-05-14 LAB — GLUCOSE, CAPILLARY
Glucose-Capillary: 118 mg/dL — ABNORMAL HIGH (ref 70–99)
Glucose-Capillary: 98 mg/dL (ref 70–99)

## 2020-05-14 MED ORDER — ONDANSETRON HCL 4 MG/2ML IJ SOLN: 4.0000 mg | Freq: Four times a day (QID) | INTRAMUSCULAR | Status: DC | PRN

## 2020-05-14 MED ORDER — ONDANSETRON 4 MG PO TBDP: 4.0000 mg | ORAL_TABLET | Freq: Four times a day (QID) | ORAL | Status: DC | PRN

## 2020-05-14 MED ORDER — ACETAMINOPHEN 325 MG PO TABS: 650.0000 mg | ORAL_TABLET | Freq: Four times a day (QID) | ORAL | Status: DC | PRN

## 2020-05-14 MED ORDER — MORPHINE BOLUS VIA INFUSION
1.0000 mg | INTRAVENOUS | Status: DC | PRN
  Filled 2020-05-14: qty 2

## 2020-05-14 MED ORDER — GLYCOPYRROLATE 1 MG PO TABS
1.0000 mg | ORAL_TABLET | ORAL | Status: DC | PRN
  Filled 2020-05-14: qty 1

## 2020-05-14 MED ORDER — MORPHINE BOLUS VIA INFUSION
4.0000 mg | INTRAVENOUS | Status: DC | PRN
  Filled 2020-05-14: qty 5

## 2020-05-14 MED ORDER — MORPHINE 100MG IN NS 100ML (1MG/ML) PREMIX INFUSION
1.0000 mg/h | INTRAVENOUS | Status: DC
  Administered 2020-05-14: 10 mg/h via INTRAVENOUS
  Administered 2020-05-14: 3 mg/h via INTRAVENOUS
  Administered 2020-05-15: 10 mg/h via INTRAVENOUS
  Filled 2020-05-14 (×3): qty 100

## 2020-05-14 MED ORDER — GLYCOPYRROLATE 0.2 MG/ML IJ SOLN: 0.2000 mg | INTRAMUSCULAR | Status: DC | PRN

## 2020-05-14 MED ORDER — HALOPERIDOL LACTATE 2 MG/ML PO CONC
0.5000 mg | ORAL | Status: DC | PRN
  Filled 2020-05-14: qty 0.3

## 2020-05-14 MED ORDER — GLYCOPYRROLATE 0.2 MG/ML IJ SOLN
0.2000 mg | INTRAMUSCULAR | Status: DC | PRN
  Administered 2020-05-14 (×3): 0.2 mg via INTRAVENOUS
  Filled 2020-05-14 (×3): qty 1

## 2020-05-14 MED ORDER — BIOTENE DRY MOUTH MT LIQD: 15.0000 mL | OROMUCOSAL | Status: DC | PRN

## 2020-05-14 MED ORDER — HALOPERIDOL 0.5 MG PO TABS
0.5000 mg | ORAL_TABLET | ORAL | Status: DC | PRN
  Filled 2020-05-14: qty 1

## 2020-05-14 MED ORDER — GLYCOPYRROLATE 0.2 MG/ML IJ SOLN
0.6000 mg | INTRAMUSCULAR | Status: DC | PRN
  Administered 2020-05-14 – 2020-05-15 (×2): 0.6 mg via INTRAVENOUS
  Filled 2020-05-14 (×2): qty 3

## 2020-05-14 MED ORDER — POLYVINYL ALCOHOL 1.4 % OP SOLN
1.0000 [drp] | Freq: Four times a day (QID) | OPHTHALMIC | Status: DC | PRN
  Filled 2020-05-14: qty 15

## 2020-05-14 MED ORDER — LORAZEPAM 2 MG/ML IJ SOLN: 1.0000 mg | INTRAMUSCULAR | Status: DC

## 2020-05-14 MED ORDER — ACETAMINOPHEN 650 MG RE SUPP: 650.0000 mg | Freq: Four times a day (QID) | RECTAL | Status: DC | PRN

## 2020-05-14 MED ORDER — HALOPERIDOL LACTATE 5 MG/ML IJ SOLN: 0.5000 mg | INTRAMUSCULAR | Status: DC | PRN

## 2020-05-14 NOTE — Progress Notes (Signed)
Patient extubated to comfort care per order. Family and RN at bedside.

## 2020-05-14 NOTE — Progress Notes (Signed)
Medical Examiner was made aware this afternoon that Andrew Suarez with [employer] is already assigned to this case.

## 2020-05-14 NOTE — Progress Notes (Signed)
Clinical update provided to wife and three children regarding MRI brain results showing significant hypoxic/ischemic injury. NSGY team notified of MRI completion and will also plan to speak with family. Deferred detailed discussion regarding prognosis to Dr. Ronnald Ramp, but did make family aware that significant functional impact would be the result. We discussed expected vs desired functional outcome. Wife inquired about options if there is a mismatch. We discussed comfort care and compassionate extubation, but before any decisions are made, will await discussion from [place]. Palliative consult was placed this AM.  Additional critical care time: 60min  Jesusita Oka, MD General and [place] [facility]
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
## 2020-05-14 NOTE — Progress Notes (Signed)
This chaplain responded to PMT consult for EOL spiritual care. The chaplain was updated by the Pt. RN-Susannah. The Pt. is joined by his wife-[name] and three children/significant others at the Pt. bedside.    The chaplain listened and answered the family's questions about disposition of the body and the role of the funeral home.  The chaplain replied to the family's questions about the possibility of organ donation with [facility] was contacted.   The chaplain accepted the invitation to pray with the family and updated the RN on the spiritual care visit.

## 2020-05-17 ENCOUNTER — Encounter (HOSPITAL_COMMUNITY): Payer: Self-pay | Admitting: Neurological Surgery

## 2020-05-21 NOTE — Progress Notes (Signed)
Patient ID: Andrew Suarez, male   DOB: [birth year], 65 y.o.   MRN: [id number] Pt expired @ 0534 per RN Pt being transferred to morgue.

## 2020-05-21 NOTE — Progress Notes (Signed)
Me made aware of pt pronouncement of death.

## 2020-05-21 NOTE — Progress Notes (Signed)
2 RN time of death verification 0534: Larey Days RN, Donato Heinz RN. Pt apneic, and pulseless auscultaion and palpation. Family notified.

## 2020-05-21 DEATH — deceased
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
